# Patient Record
Sex: Male | Born: 1945 | Race: White | Hispanic: No | Marital: Married | Smoking: Never smoker
Health system: Southern US, Community
[De-identification: ages and names within clinical notes are randomized; demographics above are authoritative.]

## PROBLEM LIST (undated history)

## (undated) DIAGNOSIS — T8859XA Other complications of anesthesia, initial encounter: Secondary | ICD-10-CM

## (undated) DIAGNOSIS — M1990 Unspecified osteoarthritis, unspecified site: Secondary | ICD-10-CM

## (undated) DIAGNOSIS — I10 Essential (primary) hypertension: Secondary | ICD-10-CM

## (undated) DIAGNOSIS — E039 Hypothyroidism, unspecified: Secondary | ICD-10-CM

## (undated) DIAGNOSIS — T4145XA Adverse effect of unspecified anesthetic, initial encounter: Secondary | ICD-10-CM

## (undated) HISTORY — PX: APPENDECTOMY: SHX54

## (undated) HISTORY — PX: SURGERY OF LIP: SUR1315

## (undated) HISTORY — PX: JOINT REPLACEMENT: SHX530

---

## 2003-12-20 ENCOUNTER — Encounter: Admission: RE | Admit: 2003-12-20 | Discharge: 2003-12-20 | Payer: Self-pay | Admitting: Internal Medicine

## 2004-05-30 ENCOUNTER — Ambulatory Visit: Payer: Self-pay | Admitting: Family Medicine

## 2004-09-06 ENCOUNTER — Ambulatory Visit: Payer: Self-pay | Admitting: Family Medicine

## 2005-06-07 ENCOUNTER — Ambulatory Visit: Payer: Self-pay | Admitting: Family Medicine

## 2007-01-16 ENCOUNTER — Encounter: Payer: Self-pay | Admitting: Internal Medicine

## 2010-06-12 ENCOUNTER — Encounter: Payer: Self-pay | Admitting: Internal Medicine

## 2013-07-09 ENCOUNTER — Other Ambulatory Visit: Payer: Self-pay | Admitting: Orthopedic Surgery

## 2013-07-14 ENCOUNTER — Encounter (HOSPITAL_COMMUNITY): Payer: Self-pay | Admitting: Pharmacy Technician

## 2013-07-15 ENCOUNTER — Other Ambulatory Visit (HOSPITAL_COMMUNITY): Payer: Self-pay | Admitting: *Deleted

## 2013-07-15 NOTE — Pre-Procedure Instructions (Signed)
Grant Jenkins  07/15/2013   Your procedure is scheduled on:  Tuesday, July 28, 2013 at 8:45 AM.   [facility] at 6:45 AM.   Call this number if you have problems the morning of surgery: [phone]   Remember:   Do not eat food or drink liquids after midnight Monday, 07/27/13.   Take these medicines the morning of surgery with A SIP OF WATER: acetaminophen (TYLENOL) - if needed.  Stop Relafen (Nabumetone) as of Tuesday, 07/22/13. Do not take any NSAIDS (Ibuprofen, Aleve, etc.) prior to surgery.    Do not wear jewelry.  Do not wear lotions, powders, or cologne. You may wear deodorant.   Men may shave face and neck.  Do not bring valuables to the hospital.  [facility] is not responsible                  for any belongings or valuables.               Contacts, dentures or bridgework may not be worn into surgery.  Leave suitcase in the car. After surgery it may be brought to your room.  For patients admitted to the hospital, discharge time is determined by your                treatment team.             Special Instructions: [place] - Preparing for Surgery  Before surgery, you can play an important role.  Because skin is not sterile, your skin needs to be as free of germs as possible.  You can reduce the number of germs on you skin by washing with CHG (chlorahexidine gluconate) soap before surgery.  CHG is an antiseptic cleaner which kills germs and bonds with the skin to continue killing germs even after washing.  Please DO NOT use if you have an allergy to CHG or antibacterial soaps.  If your skin becomes reddened/irritated stop using the CHG and inform your nurse when you arrive at Short Stay.  Do not shave (including legs and underarms) for at least 48 hours prior to the first CHG shower.  You may shave your face.  Please follow these instructions carefully:   1.  Shower with CHG Soap the night before surgery and the                                 morning of Surgery.  2.  If you choose to wash your hair, wash your hair first as usual with your       normal shampoo.  3.  After you shampoo, rinse your hair and body thoroughly to remove the                      Shampoo.  4.  Use CHG as you would any other liquid soap.  You can apply chg directly       to the skin and wash gently with scrungie or a clean washcloth.  5.  Apply the CHG Soap to your body ONLY FROM THE NECK DOWN.        Do not use on open wounds or open sores.  Avoid contact with your eyes, ears, mouth and genitals (private parts).  Wash genitals (private parts) with your normal soap.  6.  Wash thoroughly, paying special attention to the area where your surgery  will be performed.  7.  Thoroughly rinse your body with warm water from the neck down.  8.  DO NOT shower/wash with your normal soap after using and rinsing off       the CHG Soap.  9.  Pat yourself dry with a clean towel.            10.  Wear clean pajamas.            11.  Place clean sheets on your bed the night of your first shower and do not        sleep with pets.  Day of Surgery  Do not apply any lotions the morning of surgery.  Please wear clean clothes to the hospital/surgery center.     Please read over the following fact sheets that you were given: Pain Booklet, Coughing and Deep Breathing, Blood Transfusion Information, MRSA Information and Surgical Site Infection Prevention

## 2013-07-16 ENCOUNTER — Encounter (HOSPITAL_COMMUNITY)
Admission: RE | Admit: 2013-07-16 | Discharge: 2013-07-16 | Disposition: A | Discharge status: Home / Self Care | Payer: Medicare Other | Source: Ambulatory Visit | Attending: Orthopedic Surgery | Admitting: Orthopedic Surgery

## 2013-07-16 ENCOUNTER — Encounter (HOSPITAL_COMMUNITY): Payer: Self-pay

## 2013-07-16 DIAGNOSIS — Z01810 Encounter for preprocedural cardiovascular examination: Secondary | ICD-10-CM | POA: Insufficient documentation

## 2013-07-16 DIAGNOSIS — Z01812 Encounter for preprocedural laboratory examination: Secondary | ICD-10-CM | POA: Insufficient documentation

## 2013-07-16 DIAGNOSIS — Z01818 Encounter for other preprocedural examination: Secondary | ICD-10-CM | POA: Insufficient documentation

## 2013-07-16 HISTORY — DX: Other complications of anesthesia, initial encounter: T88.59XA

## 2013-07-16 HISTORY — DX: Essential (primary) hypertension: I10

## 2013-07-16 HISTORY — DX: Adverse effect of unspecified anesthetic, initial encounter: T41.45XA

## 2013-07-16 HISTORY — DX: Unspecified osteoarthritis, unspecified site: M19.90

## 2013-07-16 HISTORY — DX: Hypothyroidism, unspecified: E03.9

## 2013-07-16 LAB — URINALYSIS, ROUTINE W REFLEX MICROSCOPIC
BILIRUBIN URINE: NEGATIVE
Glucose, UA: NEGATIVE mg/dL
HGB URINE DIPSTICK: NEGATIVE
Ketones, ur: NEGATIVE mg/dL
Leukocytes, UA: NEGATIVE
NITRITE: NEGATIVE
PH: 6 (ref 5.0–8.0)
Protein, ur: NEGATIVE mg/dL
SPECIFIC GRAVITY, URINE: 1.022 (ref 1.005–1.030)
Urobilinogen, UA: 0.2 mg/dL (ref 0.0–1.0)

## 2013-07-16 LAB — COMPREHENSIVE METABOLIC PANEL
ALBUMIN: 4.3 g/dL (ref 3.5–5.2)
ALK PHOS: 59 U/L (ref 39–117)
ALT: 35 U/L (ref 0–53)
AST: 25 U/L (ref 0–37)
BUN: 18 mg/dL (ref 6–23)
CO2: 24 mEq/L (ref 19–32)
Calcium: 10.1 mg/dL (ref 8.4–10.5)
Chloride: 101 mEq/L (ref 96–112)
Creatinine, Ser: 0.82 mg/dL (ref 0.50–1.35)
GFR calc Af Amer: >90 mL/min (ref >90)
GFR calc non Af Amer: >90 mL/min (ref >90)
Glucose, Bld: 98 mg/dL (ref 70–99)
POTASSIUM: 4.1 meq/L (ref 3.7–5.3)
Sodium: 142 mEq/L (ref 137–147)
Total Bilirubin: 0.4 mg/dL (ref 0.3–1.2)
Total Protein: 7.5 g/dL (ref 6.0–8.3)

## 2013-07-16 LAB — PROTIME-INR
INR: 1.04 (ref 0.00–1.49)
Prothrombin Time: 13.4 seconds (ref 11.6–15.2)

## 2013-07-16 LAB — CBC WITH DIFFERENTIAL/PLATELET
BASOS ABS: 0 10*3/uL (ref 0.0–0.1)
BASOS PCT: 0 % (ref 0–1)
Eosinophils Absolute: 0.1 10*3/uL (ref 0.0–0.7)
Eosinophils Relative: 1 % (ref 0–5)
HCT: 47 % (ref 39.0–52.0)
HEMOGLOBIN: 16.6 g/dL (ref 13.0–17.0)
Lymphocytes Relative: 17 % (ref 12–46)
Lymphs Abs: 1.8 10*3/uL (ref 0.7–4.0)
MCH: 33.2 pg (ref 26.0–34.0)
MCHC: 35.3 g/dL (ref 30.0–36.0)
MCV: 94 fL (ref 78.0–100.0)
Monocytes Absolute: 0.8 10*3/uL (ref 0.1–1.0)
Monocytes Relative: 7 % (ref 3–12)
NEUTROS ABS: 8.1 10*3/uL — AB (ref 1.7–7.7)
NEUTROS PCT: 75 % (ref 43–77)
PLATELETS: 227 10*3/uL (ref 150–400)
RBC: 5 MIL/uL (ref 4.22–5.81)
RDW: 13.8 % (ref 11.5–15.5)
WBC: 10.8 10*3/uL — ABNORMAL HIGH (ref 4.0–10.5)

## 2013-07-16 LAB — SURGICAL PCR SCREEN
MRSA, PCR: NEGATIVE
Staphylococcus aureus: POSITIVE — AB

## 2013-07-16 LAB — APTT: APTT: 30 s (ref 24–37)

## 2013-07-16 NOTE — Pre-Procedure Instructions (Signed)
Nona DellJimmie Kant  07/16/2013   Your procedure is scheduled on:  Tuesday, July 28, 2013 at 8:45 AM.   [facility] "A" Admitting Office at 6:45 AM.   Call this number if you have problems the morning of surgery: [phone]   Remember:   Do not eat food or drink liquids after midnight Monday, 07/27/13.   Take these medicines the morning of surgery with A SIP OF WATER: acetaminophen (TYLENOL) - if needed.  Stop Relafen (Nabumetone) as of Tuesday, 07/22/13. Do not take any NSAIDS (Ibuprofen, Aleve, etc.) prior to surgery.    Do not wear jewelry.  Do not wear lotions, powders, or cologne. You may wear deodorant.   Men may shave face and neck.  Do not bring valuables to the hospital.  [facility] not [facility]                  for any belongings or valuables.               Contacts, dentures or bridgework may not be worn into surgery.  Leave suitcase in the car. After surgery it may be brought to your room.  For patients admitted to the hospital, discharge time is determined by your                treatment team.             Special Instructions: [place] - Preparing for Surgery  Before surgery, you can play an important role.  Because skin is not sterile, your skin needs to be as free of germs as possible.  You can reduce the number of germs on you skin by washing with CHG (chlorahexidine gluconate) soap before surgery.  CHG is an antiseptic cleaner which kills germs and bonds with the skin to continue killing germs even after washing.  Please DO NOT use if you have an allergy to CHG or antibacterial soaps.  If your skin becomes reddened/irritated stop using the CHG and inform your nurse when you arrive at Short Stay.  Do not shave (including legs and underarms) for at least 48 hours prior to the first CHG shower.  You may shave your face.  Please follow these instructions carefully:   1.  Shower with CHG Soap the night before surgery and the                                 morning of Surgery.  2.  If you choose to wash your hair, wash your hair first as usual with your       normal shampoo.  3.  After you shampoo, rinse your hair and body thoroughly to remove the                      Shampoo.  4.  Use CHG as you would any other liquid soap.  You can apply chg directly       to the skin and wash gently with scrungie or a clean washcloth.  5.  Apply the CHG Soap to your body ONLY FROM THE NECK DOWN.        Do not use on open wounds or open sores.  Avoid contact with your eyes, ears, mouth and genitals (private parts).  Wash genitals (private parts) with your normal soap.  6.  Wash thoroughly, paying special attention to the area where your surgery  will be performed.  7.  Thoroughly rinse your body with warm water from the neck down.  8.  DO NOT shower/wash with your normal soap after using and rinsing off       the CHG Soap.  9.  Pat yourself dry with a clean towel.            10.  Wear clean pajamas.            11.  Place clean sheets on your bed the night of your first shower and do not        sleep with pets.  Day of Surgery  Do not apply any lotions the morning of surgery.  Please wear clean clothes to the hospital/surgery center.     Please read over the following fact sheets that you were given: Pain Booklet, Coughing and Deep Breathing, Blood Transfusion Information, MRSA Information and Surgical Site Infection Prevention

## 2013-07-16 NOTE — Pre-Procedure Instructions (Signed)
Grant Jenkins  07/16/2013   Your procedure is scheduled on:  Tuesday, July 28, 2013 at 8:45 AM.   Report to [facility] Entrance "A" Admitting Office at 6:45 AM.   Call this number if you have problems the morning of surgery: [phone]   Remember:   Do not eat food or drink liquids after midnight Monday, 07/27/13.   Take these medicines the morning of surgery with A SIP OF WATER: acetaminophen (TYLENOL) - if needed.  Stop Relafen (Nabumetone) as of Tuesday, 07/22/13. Do not take any NSAIDS (Ibuprofen, Aleve, etc.) prior to surgery.    Do not wear jewelry.  Do not wear lotions, powders, or cologne. You may wear deodorant.   Men may shave face and neck.  Do not bring valuables to the hospital.  [facility] is not responsible                  for any belongings or valuables.               Contacts, dentures or bridgework may not be worn into surgery.  Leave suitcase in the car. After surgery it may be brought to your room.  For patients admitted to the hospital, discharge time is determined by your                treatment team.             Special Instructions: [place] - Preparing for Surgery  Before surgery, you can play an important role.  Because skin is not sterile, your skin needs to be as free of germs as possible.  You can reduce the number of germs on you skin by washing with CHG (chlorahexidine gluconate) soap before surgery.  CHG is an antiseptic cleaner which kills germs and bonds with the skin to continue killing germs even after washing.  Please DO NOT use if you have an allergy to CHG or antibacterial soaps.  If your skin becomes reddened/irritated stop using the CHG and inform your nurse when you arrive at Short Stay.  Do not shave (including legs and underarms) for at least 48 hours prior to the first CHG shower.  You may shave your face.  Please follow these instructions carefully:   1.  Shower with CHG Soap the night before surgery and the                                 morning of Surgery.  2.  If you choose to wash your hair, wash your hair first as usual with your       normal shampoo.  3.  After you shampoo, rinse your hair and body thoroughly to remove the                      Shampoo.  4.  Use CHG as you would any other liquid soap.  You can apply chg directly       to the skin and wash gently with scrungie or a clean washcloth.  5.  Apply the CHG Soap to your body ONLY FROM THE NECK DOWN.        Do not use on open wounds or open sores.  Avoid contact with your eyes, ears, mouth and genitals (private parts).  Wash genitals (private parts) with your normal soap.  6.  Wash thoroughly, paying special attention to the area where your surgery          will be performed.  7.  Thoroughly rinse your body with warm water from the neck down.  8.  DO NOT shower/wash with your normal soap after using and rinsing off       the CHG Soap.  9.  Pat yourself dry with a clean towel.            10.  Wear clean pajamas.            11.  Place clean sheets on your bed the night of your first shower and do not        sleep with pets.  Day of Surgery  Do not apply any lotions the morning of surgery.  Please wear clean clothes to the hospital/surgery center.     Please read over the following fact sheets that you were given: Pain Booklet, Coughing and Deep Breathing, Blood Transfusion Information, MRSA Information and Surgical Site Infection Prevention

## 2013-07-17 LAB — URINE CULTURE
COLONY COUNT: NO GROWTH
CULTURE: NO GROWTH

## 2013-07-27 MED ORDER — SODIUM CHLORIDE 0.9 % IV SOLN
1000.0000 mg | INTRAVENOUS | Status: AC
  Administered 2013-07-28: 1000 mg via INTRAVENOUS
  Filled 2013-07-27: qty 10

## 2013-07-27 MED ORDER — SODIUM CHLORIDE 0.9 % IV SOLN
1500.0000 mg | INTRAVENOUS | Status: AC
  Administered 2013-07-28: 1500 mg via INTRAVENOUS
  Filled 2013-07-27: qty 1500

## 2013-07-28 ENCOUNTER — Inpatient Hospital Stay (HOSPITAL_COMMUNITY): Payer: Medicare Other | Admitting: Certified Registered Nurse Anesthetist

## 2013-07-28 ENCOUNTER — Encounter (HOSPITAL_COMMUNITY)
Admission: RE | Disposition: A | Discharge status: Home Health | Payer: Self-pay | Source: Ambulatory Visit | Attending: Orthopedic Surgery

## 2013-07-28 ENCOUNTER — Encounter (HOSPITAL_COMMUNITY): Payer: Self-pay | Admitting: *Deleted

## 2013-07-28 ENCOUNTER — Encounter (HOSPITAL_COMMUNITY): Payer: Medicare Other | Admitting: Certified Registered Nurse Anesthetist

## 2013-07-28 ENCOUNTER — Inpatient Hospital Stay (HOSPITAL_COMMUNITY)
Admission: RE | Admit: 2013-07-28 | Discharge: 2013-07-30 | DRG: 470 | Disposition: A | Discharge status: Home Health | Payer: Medicare Other | Source: Ambulatory Visit | Attending: Orthopedic Surgery | Admitting: Orthopedic Surgery

## 2013-07-28 DIAGNOSIS — Z7982 Long term (current) use of aspirin: Secondary | ICD-10-CM

## 2013-07-28 DIAGNOSIS — Z880 Allergy status to penicillin: Secondary | ICD-10-CM

## 2013-07-28 DIAGNOSIS — E039 Hypothyroidism, unspecified: Secondary | ICD-10-CM | POA: Diagnosis present

## 2013-07-28 DIAGNOSIS — Z885 Allergy status to narcotic agent status: Secondary | ICD-10-CM

## 2013-07-28 DIAGNOSIS — D62 Acute posthemorrhagic anemia: Secondary | ICD-10-CM | POA: Diagnosis not present

## 2013-07-28 DIAGNOSIS — Z79899 Other long term (current) drug therapy: Secondary | ICD-10-CM

## 2013-07-28 DIAGNOSIS — M1710 Unilateral primary osteoarthritis, unspecified knee: Principal | ICD-10-CM | POA: Diagnosis present

## 2013-07-28 DIAGNOSIS — I10 Essential (primary) hypertension: Secondary | ICD-10-CM | POA: Diagnosis present

## 2013-07-28 DIAGNOSIS — Z96659 Presence of unspecified artificial knee joint: Secondary | ICD-10-CM

## 2013-07-28 HISTORY — PX: TOTAL KNEE ARTHROPLASTY: SHX125

## 2013-07-28 LAB — CBC
HCT: 41.6 % (ref 39.0–52.0)
Hemoglobin: 14.2 g/dL (ref 13.0–17.0)
MCH: 32.4 pg (ref 26.0–34.0)
MCHC: 34.1 g/dL (ref 30.0–36.0)
MCV: 95 fL (ref 78.0–100.0)
Platelets: 177 10*3/uL (ref 150–400)
RBC: 4.38 MIL/uL (ref 4.22–5.81)
RDW: 13.5 % (ref 11.5–15.5)
WBC: 9.6 10*3/uL (ref 4.0–10.5)

## 2013-07-28 LAB — TYPE AND SCREEN
ABO/RH(D): O POS
Antibody Screen: NEGATIVE

## 2013-07-28 LAB — CREATININE, SERUM
CREATININE: 0.85 mg/dL (ref 0.50–1.35)
GFR calc Af Amer: >90 mL/min (ref >90)
GFR calc non Af Amer: 89 mL/min — ABNORMAL LOW (ref >90)

## 2013-07-28 LAB — ABO/RH: ABO/RH(D): O POS

## 2013-07-28 SURGERY — ARTHROPLASTY, KNEE, TOTAL
Anesthesia: Regional | Site: Knee | Laterality: Left

## 2013-07-28 MED ORDER — ONDANSETRON HCL 4 MG/2ML IJ SOLN
INTRAMUSCULAR | Status: DC | PRN
  Administered 2013-07-28: 4 mg via INTRAVENOUS

## 2013-07-28 MED ORDER — SENNOSIDES-DOCUSATE SODIUM 8.6-50 MG PO TABS: 1.0000 | ORAL_TABLET | Freq: Every evening | ORAL | Status: DC | PRN

## 2013-07-28 MED ORDER — ROCURONIUM BROMIDE 50 MG/5ML IV SOLN
INTRAVENOUS | Status: AC
  Filled 2013-07-28: qty 1

## 2013-07-28 MED ORDER — STERILE WATER FOR INJECTION IJ SOLN
INTRAMUSCULAR | Status: AC
  Filled 2013-07-28: qty 10

## 2013-07-28 MED ORDER — ENOXAPARIN SODIUM 30 MG/0.3ML ~~LOC~~ SOLN
30.0000 mg | Freq: Two times a day (BID) | SUBCUTANEOUS | Status: DC
  Administered 2013-07-29 – 2013-07-30 (×3): 30 mg via SUBCUTANEOUS
  Filled 2013-07-28 (×5): qty 0.3

## 2013-07-28 MED ORDER — ROPIVACAINE HCL 5 MG/ML IJ SOLN
INTRAMUSCULAR | Status: DC | PRN
  Administered 2013-07-28: 15 mL via PERINEURAL

## 2013-07-28 MED ORDER — OXYCODONE HCL ER 10 MG PO T12A
10.0000 mg | EXTENDED_RELEASE_TABLET | Freq: Two times a day (BID) | ORAL | Status: DC
  Administered 2013-07-28: 10 mg via ORAL
  Filled 2013-07-28 (×2): qty 1

## 2013-07-28 MED ORDER — MIDAZOLAM HCL 2 MG/2ML IJ SOLN
INTRAMUSCULAR | Status: AC
  Filled 2013-07-28: qty 2

## 2013-07-28 MED ORDER — PROPOFOL 10 MG/ML IV BOLUS
INTRAVENOUS | Status: AC
  Filled 2013-07-28: qty 20

## 2013-07-28 MED ORDER — FENTANYL CITRATE 0.05 MG/ML IJ SOLN
INTRAMUSCULAR | Status: AC
  Filled 2013-07-28: qty 5

## 2013-07-28 MED ORDER — METHOCARBAMOL 100 MG/ML IJ SOLN
500.0000 mg | Freq: Four times a day (QID) | INTRAMUSCULAR | Status: DC | PRN
  Filled 2013-07-28: qty 5

## 2013-07-28 MED ORDER — HYDROMORPHONE HCL PF 1 MG/ML IJ SOLN
1.0000 mg | INTRAMUSCULAR | Status: DC | PRN
  Administered 2013-07-28 – 2013-07-29 (×2): 1 mg via INTRAVENOUS
  Filled 2013-07-28 (×2): qty 1

## 2013-07-28 MED ORDER — MIDAZOLAM HCL 2 MG/2ML IJ SOLN
INTRAMUSCULAR | Status: AC
  Administered 2013-07-28: 2 mg
  Filled 2013-07-28: qty 2

## 2013-07-28 MED ORDER — AMLODIPINE BESYLATE 5 MG PO TABS
5.0000 mg | ORAL_TABLET | Freq: Every day | ORAL | Status: DC
  Administered 2013-07-29: 5 mg via ORAL
  Filled 2013-07-28 (×3): qty 1

## 2013-07-28 MED ORDER — LACTATED RINGERS IV SOLN
INTRAVENOUS | Status: DC
  Administered 2013-07-28 (×2): via INTRAVENOUS

## 2013-07-28 MED ORDER — BUPIVACAINE LIPOSOME 1.3 % IJ SUSP
20.0000 mL | Freq: Once | INTRAMUSCULAR | Status: DC
  Filled 2013-07-28: qty 20

## 2013-07-28 MED ORDER — METHOTREXATE 2.5 MG PO TABS
10.0000 mg | ORAL_TABLET | ORAL | Status: DC
Start: 2013-08-02 — End: 2013-07-30

## 2013-07-28 MED ORDER — ONDANSETRON HCL 4 MG/2ML IJ SOLN
INTRAMUSCULAR | Status: AC
  Filled 2013-07-28: qty 2

## 2013-07-28 MED ORDER — METHOCARBAMOL 500 MG PO TABS
500.0000 mg | ORAL_TABLET | Freq: Four times a day (QID) | ORAL | Status: DC | PRN
  Administered 2013-07-28 – 2013-07-29 (×2): 500 mg via ORAL
  Filled 2013-07-28 (×2): qty 1

## 2013-07-28 MED ORDER — OLMESARTAN-AMLODIPINE-HCTZ 40-5-12.5 MG PO TABS: 1.0000 | ORAL_TABLET | Freq: Every day | ORAL | Status: DC

## 2013-07-28 MED ORDER — HYDROCHLOROTHIAZIDE 12.5 MG PO CAPS
12.5000 mg | ORAL_CAPSULE | Freq: Every day | ORAL | Status: DC
  Administered 2013-07-29: 12.5 mg via ORAL
  Filled 2013-07-28 (×3): qty 1

## 2013-07-28 MED ORDER — BUPIVACAINE-EPINEPHRINE (PF) 0.5% -1:200000 IJ SOLN
INTRAMUSCULAR | Status: AC
  Filled 2013-07-28: qty 10

## 2013-07-28 MED ORDER — CHLORHEXIDINE GLUCONATE 4 % EX LIQD: 60.0000 mL | Freq: Once | CUTANEOUS | Status: DC

## 2013-07-28 MED ORDER — NABUMETONE 500 MG PO TABS
1000.0000 mg | ORAL_TABLET | Freq: Two times a day (BID) | ORAL | Status: DC | PRN
  Filled 2013-07-28 (×2): qty 2

## 2013-07-28 MED ORDER — DOCUSATE SODIUM 100 MG PO CAPS
100.0000 mg | ORAL_CAPSULE | Freq: Two times a day (BID) | ORAL | Status: DC
  Administered 2013-07-28 – 2013-07-29 (×3): 100 mg via ORAL
  Filled 2013-07-28 (×5): qty 1

## 2013-07-28 MED ORDER — CELECOXIB 200 MG PO CAPS
200.0000 mg | ORAL_CAPSULE | Freq: Two times a day (BID) | ORAL | Status: DC
  Administered 2013-07-28 – 2013-07-29 (×3): 200 mg via ORAL
  Filled 2013-07-28 (×6): qty 1

## 2013-07-28 MED ORDER — OXYCODONE HCL 5 MG PO TABS
5.0000 mg | ORAL_TABLET | ORAL | Status: DC | PRN
  Administered 2013-07-28 – 2013-07-29 (×4): 10 mg via ORAL
  Filled 2013-07-28 (×4): qty 2

## 2013-07-28 MED ORDER — METOCLOPRAMIDE HCL 5 MG/ML IJ SOLN: 5.0000 mg | Freq: Three times a day (TID) | INTRAMUSCULAR | Status: DC | PRN

## 2013-07-28 MED ORDER — BUPIVACAINE IN DEXTROSE 0.75-8.25 % IT SOLN
INTRATHECAL | Status: DC | PRN
  Administered 2013-07-28: 1.8 mL via INTRATHECAL

## 2013-07-28 MED ORDER — VANCOMYCIN HCL IN DEXTROSE 1-5 GM/200ML-% IV SOLN
1000.0000 mg | Freq: Two times a day (BID) | INTRAVENOUS | Status: AC
  Administered 2013-07-28: 1000 mg via INTRAVENOUS
  Filled 2013-07-28: qty 200

## 2013-07-28 MED ORDER — ONDANSETRON HCL 4 MG/2ML IJ SOLN: 4.0000 mg | Freq: Four times a day (QID) | INTRAMUSCULAR | Status: DC | PRN

## 2013-07-28 MED ORDER — FENTANYL CITRATE 0.05 MG/ML IJ SOLN
100.0000 ug | Freq: Once | INTRAMUSCULAR | Status: AC
  Administered 2013-07-28: 50 ug via INTRAVENOUS

## 2013-07-28 MED ORDER — MENTHOL 3 MG MT LOZG: 1.0000 | LOZENGE | OROMUCOSAL | Status: DC | PRN

## 2013-07-28 MED ORDER — PHENOL 1.4 % MT LIQD: 1.0000 | OROMUCOSAL | Status: DC | PRN

## 2013-07-28 MED ORDER — DIPHENHYDRAMINE HCL 12.5 MG/5ML PO ELIX: 12.5000 mg | ORAL_SOLUTION | ORAL | Status: DC | PRN

## 2013-07-28 MED ORDER — MIDAZOLAM HCL 5 MG/ML IJ SOLN: 2.0000 mg | Freq: Once | INTRAMUSCULAR | Status: DC

## 2013-07-28 MED ORDER — FENTANYL CITRATE 0.05 MG/ML IJ SOLN
INTRAMUSCULAR | Status: AC
  Administered 2013-07-28: 50 ug via INTRAVENOUS
  Filled 2013-07-28: qty 2

## 2013-07-28 MED ORDER — ONDANSETRON HCL 4 MG PO TABS
4.0000 mg | ORAL_TABLET | Freq: Four times a day (QID) | ORAL | Status: DC | PRN
  Administered 2013-07-29: 4 mg via ORAL
  Filled 2013-07-28: qty 1

## 2013-07-28 MED ORDER — BISACODYL 5 MG PO TBEC
5.0000 mg | DELAYED_RELEASE_TABLET | Freq: Every day | ORAL | Status: DC | PRN
Start: 2013-07-28 — End: 2013-07-30

## 2013-07-28 MED ORDER — BUPIVACAINE LIPOSOME 1.3 % IJ SUSP
INTRAMUSCULAR | Status: DC | PRN
  Administered 2013-07-28: 20 mL

## 2013-07-28 MED ORDER — SODIUM CHLORIDE 0.9 % IR SOLN
Status: DC | PRN
  Administered 2013-07-28 (×2): 1000 mL

## 2013-07-28 MED ORDER — ACETAMINOPHEN 325 MG PO TABS: 650.0000 mg | ORAL_TABLET | Freq: Four times a day (QID) | ORAL | Status: DC | PRN

## 2013-07-28 MED ORDER — SODIUM CHLORIDE 0.9 % IV SOLN: INTRAVENOUS | Status: DC

## 2013-07-28 MED ORDER — FLEET ENEMA 7-19 GM/118ML RE ENEM: 1.0000 | ENEMA | Freq: Once | RECTAL | Status: AC | PRN

## 2013-07-28 MED ORDER — ARTIFICIAL TEARS OP OINT
TOPICAL_OINTMENT | OPHTHALMIC | Status: AC
  Filled 2013-07-28: qty 3.5

## 2013-07-28 MED ORDER — BUPIVACAINE-EPINEPHRINE 0.5% -1:200000 IJ SOLN
INTRAMUSCULAR | Status: DC | PRN
  Administered 2013-07-28: 30 mL

## 2013-07-28 MED ORDER — PROPOFOL INFUSION 10 MG/ML OPTIME
INTRAVENOUS | Status: DC | PRN
  Administered 2013-07-28: 50 ug/kg/min via INTRAVENOUS

## 2013-07-28 MED ORDER — METOCLOPRAMIDE HCL 10 MG PO TABS
5.0000 mg | ORAL_TABLET | Freq: Three times a day (TID) | ORAL | Status: DC | PRN
  Administered 2013-07-29: 10 mg via ORAL
  Filled 2013-07-28: qty 1

## 2013-07-28 MED ORDER — ALUM & MAG HYDROXIDE-SIMETH 200-200-20 MG/5ML PO SUSP: 30.0000 mL | ORAL | Status: DC | PRN

## 2013-07-28 MED ORDER — MIDAZOLAM HCL 5 MG/5ML IJ SOLN
INTRAMUSCULAR | Status: DC | PRN
  Administered 2013-07-28 (×2): 1 mg via INTRAVENOUS

## 2013-07-28 MED ORDER — EPHEDRINE SULFATE 50 MG/ML IJ SOLN
INTRAMUSCULAR | Status: AC
  Filled 2013-07-28: qty 1

## 2013-07-28 MED ORDER — ACETAMINOPHEN 650 MG RE SUPP: 650.0000 mg | Freq: Four times a day (QID) | RECTAL | Status: DC | PRN

## 2013-07-28 MED ORDER — SUCCINYLCHOLINE CHLORIDE 20 MG/ML IJ SOLN
INTRAMUSCULAR | Status: AC
  Filled 2013-07-28: qty 1

## 2013-07-28 MED ORDER — IRBESARTAN 300 MG PO TABS
300.0000 mg | ORAL_TABLET | Freq: Every day | ORAL | Status: DC
  Administered 2013-07-29: 300 mg via ORAL
  Filled 2013-07-28 (×3): qty 1

## 2013-07-28 SURGICAL SUPPLY — 61 items
BANDAGE ESMARK 6X9 LF (GAUZE/BANDAGES/DRESSINGS) ×1 IMPLANT
BLADE SAGITTAL 13X1.27X60 (BLADE) ×2 IMPLANT
BLADE SAGITTAL 13X1.27X60MM (BLADE) ×1
BLADE SAW SGTL 83.5X18.5 (BLADE) ×3 IMPLANT
BNDG ESMARK 6X9 LF (GAUZE/BANDAGES/DRESSINGS) ×3
BOWL SMART MIX CTS (DISPOSABLE) ×3 IMPLANT
CAP POR TM CP VIT E LN CER HD ×3 IMPLANT
CEMENT BONE SIMPLEX SPEEDSET (Cement) ×3 IMPLANT
COVER SURGICAL LIGHT HANDLE (MISCELLANEOUS) ×3 IMPLANT
CUFF TOURNIQUET SINGLE 34IN LL (TOURNIQUET CUFF) ×3 IMPLANT
DRAPE EXTREMITY T 121X128X90 (DRAPE) ×3 IMPLANT
DRAPE INCISE IOBAN 66X45 STRL (DRAPES) ×6 IMPLANT
DRAPE PROXIMA HALF (DRAPES) ×3 IMPLANT
DRAPE U-SHAPE 47X51 STRL (DRAPES) ×3 IMPLANT
DRSG ADAPTIC 3X8 NADH LF (GAUZE/BANDAGES/DRESSINGS) ×3 IMPLANT
DRSG PAD ABDOMINAL 8X10 ST (GAUZE/BANDAGES/DRESSINGS) ×6 IMPLANT
DURAPREP 26ML APPLICATOR (WOUND CARE) ×3 IMPLANT
ELECT REM PT RETURN 9FT ADLT (ELECTROSURGICAL) ×3
ELECTRODE REM PT RTRN 9FT ADLT (ELECTROSURGICAL) ×1 IMPLANT
EVACUATOR 1/8 PVC DRAIN (DRAIN) ×3 IMPLANT
GLOVE BIOGEL M 7.0 STRL (GLOVE) ×3 IMPLANT
GLOVE BIOGEL PI IND STRL 7.0 (GLOVE) ×1 IMPLANT
GLOVE BIOGEL PI IND STRL 7.5 (GLOVE) ×1 IMPLANT
GLOVE BIOGEL PI IND STRL 8 (GLOVE) ×1 IMPLANT
GLOVE BIOGEL PI IND STRL 8.5 (GLOVE) ×1 IMPLANT
GLOVE BIOGEL PI INDICATOR 7.0 (GLOVE) ×2
GLOVE BIOGEL PI INDICATOR 7.5 (GLOVE) ×2
GLOVE BIOGEL PI INDICATOR 8 (GLOVE) ×2
GLOVE BIOGEL PI INDICATOR 8.5 (GLOVE) ×2
GLOVE BIOGEL PI ORTHO PRO 7.5 (GLOVE) ×2
GLOVE ECLIPSE 6.5 STRL STRAW (GLOVE) ×3 IMPLANT
GLOVE PI ORTHO PRO STRL 7.5 (GLOVE) ×1 IMPLANT
GLOVE SURG ORTHO 8.0 STRL STRW (GLOVE) ×6 IMPLANT
GLOVE SURG SS PI 7.5 STRL IVOR (GLOVE) ×3 IMPLANT
GOWN PREVENTION PLUS XLARGE (GOWN DISPOSABLE) ×9 IMPLANT
GOWN STRL NON-REIN LRG LVL3 (GOWN DISPOSABLE) ×6 IMPLANT
HANDPIECE INTERPULSE COAX TIP (DISPOSABLE) ×2
HOOD PEEL AWAY FACE SHEILD DIS (HOOD) ×12 IMPLANT
KIT BASIN OR (CUSTOM PROCEDURE TRAY) ×3 IMPLANT
KIT ROOM TURNOVER OR (KITS) ×3 IMPLANT
MANIFOLD NEPTUNE II (INSTRUMENTS) ×3 IMPLANT
NEEDLE 22X1 1/2 (OR ONLY) (NEEDLE) ×6 IMPLANT
NS IRRIG 1000ML POUR BTL (IV SOLUTION) ×3 IMPLANT
PACK TOTAL JOINT (CUSTOM PROCEDURE TRAY) ×3 IMPLANT
PAD ARMBOARD 7.5X6 YLW CONV (MISCELLANEOUS) ×6 IMPLANT
PADDING CAST COTTON 6X4 STRL (CAST SUPPLIES) ×3 IMPLANT
SET HNDPC FAN SPRY TIP SCT (DISPOSABLE) ×1 IMPLANT
SPONGE GAUZE 4X4 12PLY (GAUZE/BANDAGES/DRESSINGS) ×3 IMPLANT
STAPLER VISISTAT 35W (STAPLE) ×3 IMPLANT
SUCTION FRAZIER TIP 10 FR DISP (SUCTIONS) ×3 IMPLANT
SUT BONE WAX W31G (SUTURE) ×3 IMPLANT
SUT VIC AB 0 CTB1 27 (SUTURE) ×6 IMPLANT
SUT VIC AB 1 CT1 27 (SUTURE) ×6
SUT VIC AB 1 CT1 27XBRD ANBCTR (SUTURE) ×3 IMPLANT
SUT VIC AB 2-0 CT1 27 (SUTURE) ×4
SUT VIC AB 2-0 CT1 TAPERPNT 27 (SUTURE) ×2 IMPLANT
SYR CONTROL 10ML LL (SYRINGE) ×6 IMPLANT
TOWEL OR 17X24 6PK STRL BLUE (TOWEL DISPOSABLE) ×3 IMPLANT
TOWEL OR 17X26 10 PK STRL BLUE (TOWEL DISPOSABLE) ×3 IMPLANT
TRAY FOLEY CATH 14FR (SET/KITS/TRAYS/PACK) IMPLANT
WATER STERILE IRR 1000ML POUR (IV SOLUTION) IMPLANT

## 2013-07-28 NOTE — Transfer of Care (Signed)
Immediate Anesthesia Transfer of Care Note  Patient: Grant Jenkins  Procedure(s) Performed: Procedure(s): TOTAL KNEE ARTHROPLASTY (Left)  Patient Location: PACU  Anesthesia Type:MAC combined with regional for post-op pain  Level of Consciousness: awake, alert  and oriented  Airway & Oxygen Therapy: Patient Spontanous Breathing and Patient connected to nasal cannula oxygen  Post-op Assessment: Report given to PACU RN and Post -op Vital signs reviewed and stable  Post vital signs: Reviewed and stable  Complications: No apparent anesthesia complications

## 2013-07-28 NOTE — Anesthesia Procedure Notes (Addendum)
Procedure Name: MAC Date/Time: 07/28/2013 8:37 AM Performed by: Margaree MackintoshYACOUB, ALISHA B Pre-anesthesia Checklist: Patient identified, Emergency Drugs available, Suction available, Patient being monitored and Timeout performed Patient Re-evaluated:Patient Re-evaluated prior to inductionOxygen Delivery Method: Simple face mask Preoxygenation: Pre-oxygenation with 100% oxygen Intubation Type: IV induction Placement Confirmation: positive ETCO2 Dental Injury: Teeth and Oropharynx as per pre-operative assessment     Spinal  Patient location during procedure: pre-op Start time: 07/28/2013 8:23 AM End time: 07/28/2013 8:27 AM Staffing Anesthesiologist: Axell Trigueros, CHRIS Performed by: anesthesiologist  Preanesthetic Checklist Completed: patient identified, surgical consent, pre-op evaluation, timeout performed, IV checked, risks and benefits discussed and monitors and equipment checked Spinal Block Patient position: sitting Prep: Betadine Patient monitoring: cardiac monitor, continuous pulse ox and blood pressure Approach: midline Location: L4-5 Injection technique: single-shot Needle Needle type: Pencan  Needle gauge: 24 G Needle length: 10 cm Assessment Sensory level: T6 Additional Notes Functioning IV was confirmed and monitors were applied. Sterile prep and drape, including hand hygiene and sterile gloves were used. The patient was positioned and the spine was prepped. The skin was anesthetized with lidocaine.  Free flow of clear CSF was obtained prior to injecting local anesthetic into the CSF.  The spinal needle aspirated freely following injection.  The needle was carefully withdrawn.  The patient tolerated the procedure well.   Anesthesia Regional Block:  Adductor canal block  Pre-Anesthetic Checklist: ,, timeout performed, Correct Patient, Correct Site, Correct Laterality, Correct Procedure, Correct Position, site marked, Risks and benefits discussed,  Surgical consent,  Pre-op evaluation,  At  surgeon's request and post-op pain management  Laterality: Lower and Left  Prep: chloraprep       Needles:  Injection technique: Single-shot  Needle Type: Echogenic Needle          Additional Needles:  Procedures: ultrasound guided (picture in chart) Adductor canal block Narrative:  Start time: 07/28/2013 8:02 AM End time: 07/28/2013 8:19 AM Injection made incrementally with aspirations every 5 mL.  Performed by: Personally  Anesthesiologist: Efrem Pitstick  Additional Notes: H+P and labs reviewed, risks and benefits discussed with patient, procedure tolerated well without complications

## 2013-07-28 NOTE — Anesthesia Preprocedure Evaluation (Signed)
Anesthesia Evaluation  Patient identified by MRN, date of birth, ID band Patient awake    Reviewed: Allergy & Precautions, H&P , NPO status , Patient's Chart, lab work & pertinent test results  History of Anesthesia Complications Negative for: history of anesthetic complications  Airway Mallampati: II TM Distance: >3 FB Neck ROM: Full    Dental  (+) Teeth Intact   Pulmonary neg pulmonary ROS,  breath sounds clear to auscultation        Cardiovascular hypertension, Pt. on medications - angina- CAD, - Past MI, - CHF and - DOE Rhythm:Regular Rate:Normal     Neuro/Psych negative neurological ROS  negative psych ROS   GI/Hepatic negative GI ROS, Neg liver ROS,   Endo/Other  negative endocrine ROS  Renal/GU negative Renal ROS     Musculoskeletal  (+) Arthritis -, Osteoarthritis,    Abdominal   Peds  Hematology   Anesthesia Other Findings   Reproductive/Obstetrics                           Anesthesia Physical Anesthesia Plan  ASA: II  Anesthesia Plan: General and Regional   Post-op Pain Management:    Induction: Intravenous  Airway Management Planned: LMA  Additional Equipment: None  Intra-op Plan:   Post-operative Plan: Extubation in OR  Informed Consent: I have reviewed the patients History and Physical, chart, labs and discussed the procedure including the risks, benefits and alternatives for the proposed anesthesia with the patient or authorized representative who has indicated his/her understanding and acceptance.   Dental advisory given  Plan Discussed with: CRNA and Surgeon  Anesthesia Plan Comments:         Anesthesia Quick Evaluation

## 2013-07-28 NOTE — Preoperative (Signed)
Beta Blockers   Reason not to administer Beta Blockers:Not Applicable 

## 2013-07-28 NOTE — Evaluation (Signed)
Physical Therapy Evaluation Patient Details Name: Grant Jenkins MRN: [id number] DOB: 1945/10/12 Today's Date: 07/28/2013 Time: 4098-1191 PT Time Calculation (min): 24 min  PT Assessment / Plan / Recommendation History of Present Illness  Patient is a 68 yo male s/p Lt TKA.  Clinical Impression  Patient presents with problems listed below.  Will benefit from acute PT to maximize independence prior to discharge home with wife.    PT Assessment  Patient needs continued PT services    Follow Up Recommendations  Home health PT;Supervision/Assistance - 24 hour    Does the patient have the potential to tolerate intense rehabilitation      Barriers to Discharge        Equipment Recommendations  None recommended by PT    Recommendations for Other Services     Frequency 7X/week    Precautions / Restrictions Precautions Precautions: Knee Precaution Booklet Issued: Yes (comment) Precaution Comments: Reviewed precautions with patient and wife. Restrictions Weight Bearing Restrictions: Yes LLE Weight Bearing: Weight bearing as tolerated   Pertinent Vitals/Pain Pain and nausea limiting session today.      Mobility  Bed Mobility Overal bed mobility: Needs Assistance Bed Mobility: Supine to Sit Supine to sit: Min assist General bed mobility comments: Verbal cues for technique.  Assist to move LLE off of bed.  Good sitting balance once upright. Transfers Overall transfer level: Needs assistance Equipment used: Rolling walker (2 wheeled) Transfers: Sit to/from Stand Sit to Stand: Min assist General transfer comment: Verbal cues for hand placement and technique.  Assist to rise to standing and for balance initially. Ambulation/Gait Ambulation/Gait assistance: Min assist Ambulation Distance (Feet): 12 Feet Assistive device: Rolling walker (2 wheeled) Gait Pattern/deviations: Step-to pattern;Decreased stance time - left;Decreased step length - right;Decreased weight shift to  left;Antalgic;Trunk flexed Gait velocity interpretation: Below normal speed for age/gender General Gait Details: Verbal cues for safe use of RW, gait sequence.  Patient able to ambulate 12'.  Once in chair, patient became nauseated.  RN called to room.    Exercises Total Joint Exercises Ankle Circles/Pumps: AROM;Both;10 reps;Seated   PT Diagnosis: Difficulty walking;Acute pain  PT Problem List: Decreased strength;Decreased range of motion;Decreased activity tolerance;Decreased balance;Decreased mobility;Decreased knowledge of use of DME;Decreased knowledge of precautions;Pain PT Treatment Interventions: DME instruction;Gait training;Stair training;Functional mobility training;Therapeutic exercise;Patient/family education     PT Goals(Current goals can be found in the care plan section) Acute Rehab PT Goals Patient Stated Goal: To go home tomorrow PT Goal Formulation: With patient/family Time For Goal Achievement: 08/04/13 Potential to Achieve Goals: Good  Visit Information  Last PT Received On: 07/28/13 Assistance Needed: +1 History of Present Illness: Patient is a 68 yo male s/p Lt TKA.       Prior Functioning  Home Living Family/patient expects to be discharged to:: Private residence Living Arrangements: Spouse/significant other Available Help at Discharge: Family;Available 24 hours/day Type of Home: House Home Access: Stairs to enter [employer] of Steps: 3 Entrance Stairs-Rails: None Home Layout: One level Home Equipment: Walker - 2 wheels;Bedside commode Prior Function Level of Independence: Independent Communication Communication: No difficulties    Cognition  Cognition Arousal/Alertness: Awake/alert Behavior During Therapy: WFL for tasks assessed/performed Overall Cognitive Status: Within Functional Limits for tasks assessed    Extremity/Trunk Assessment Upper Extremity Assessment Upper Extremity Assessment: Overall WFL for tasks assessed Lower  Extremity Assessment Lower Extremity Assessment: LLE deficits/detail LLE Deficits / Details: Limited strength due to pain/surgery. LLE: Unable to fully assess due to pain   Balance  End of Session PT - End of Session Equipment Utilized During Treatment: Gait belt Activity Tolerance: Patient limited by pain (Limited by nausea) Patient left: in chair;with call bell/phone within reach;with family/visitor present;with nursing/sitter in room Nurse Communication: Mobility status (Nauseated) CPM Left Knee CPM Left Knee: Off  GP     Vena AustriaDavis, Brodan Grewell H 07/28/2013, 4:59 PM Durenda HurtSusan H. Renaldo Fiddleravis, PT, [facility] Acute Rehab Services Pager [phone]

## 2013-07-28 NOTE — Care Management Note (Signed)
CARE MANAGEMENT NOTE 07/28/2013  Patient:  Leather,Ishan   Account Number:  [id number]  Date Initiated:  07/28/2013  Documentation initiated by:  Vance PeperBRADY,Dustee Bottenfield  Subjective/Objective Assessment:   68 yr old male s/p left total knee arthroplasty.     Action/Plan:   Case manager spoke to patient and wife concerning home health and DME needs at discharge. [facility]. DME has been delivered. Has family support at discharge.   Anticipated DC Date:  07/29/2013   Anticipated DC Plan:  HOME W HOME HEALTH SERVICES      DC Planning Services  CM consult      [facility]  [facility]  DURABLE MEDICAL EQUIPMENT   Choice offered to / List presented to:  C-1 Patient   DME arranged  CPM  3-N-1  WALKER - ROLLING      DME agency  [employer]     HH arranged  HH-2 PT      [facility]   Status of service:  Completed, signed off Medicare Important Message given?   (If response is "NO", the following Medicare IM given date fields will be blank) Date Medicare IM given:   Date Additional Medicare IM given:    Discharge Disposition:  HOME W HOME HEALTH SERVICES

## 2013-07-28 NOTE — H&P (Signed)
Grant Jenkins MRN:  [id number] DOB/SEX:  11-23-1945/male  CHIEF COMPLAINT:  Painful left Knee  HISTORY: Patient is a 68 y.o. male presented with a history of pain in the left knee. Onset of symptoms was gradual starting several years ago with gradually worsening course since that time. Prior procedures on the knee include none. Patient has been treated conservatively with over-the-counter NSAIDs and activity modification. Patient currently rates pain in the knee at 9 out of 10 with activity. There is pain at night.  PAST MEDICAL HISTORY: There are no active problems to display for this patient.  Past Medical History  Diagnosis Date  . Hypothyroidism   . Arthritis     psoriatic  . Hypertension     Grant Jenkins   Grant Jenkins  . Complication of anesthesia     pt stated he was slow to wake up   Past Surgical History  Procedure Laterality Date  . Appendectomy    . Joint replacement      cut from accident  . Surgery of lip       MEDICATIONS:   Prescriptions prior to admission  Medication Sig Dispense Refill  . acetaminophen (TYLENOL) 325 MG tablet Take 650 mg by mouth every 6 (six) hours as needed for mild pain, moderate pain, fever or headache.      Marland Kitchen. aspirin EC 81 MG tablet Take 81 mg by mouth daily.      . clobetasol cream (TEMOVATE) 0.05 % Apply 1 application topically 2 (two) times daily.      . folic acid (FOLVITE) 1 MG tablet Take 1 mg by mouth daily.      . methotrexate (RHEUMATREX) 2.5 MG tablet Take 10 mg by mouth once a week. Caution:Chemotherapy. Protect from light.      . nabumetone (RELAFEN) 500 MG tablet Take 1,000 mg by mouth 2 (two) times daily as needed for mild pain or moderate pain.      . Olmesartan-Amlodipine-HCTZ (TRIBENZOR) 40-5-12.5 MG TABS Take 1 tablet by mouth daily.        ALLERGIES:   Allergies  Allergen Reactions  . Hydrocodone Nausea And Vomiting  . Penicillins     REVIEW OF SYSTEMS:  Pertinent items are noted in HPI.   FAMILY HISTORY:   History reviewed. No pertinent family history.  SOCIAL HISTORY:   History  Substance Use Topics  . Smoking status: Never Smoker   . Smokeless tobacco: Not on file  . Alcohol Use: No     EXAMINATION:  Vital signs in last 24 hours:    General appearance: alert, cooperative and no distress Lungs: clear to auscultation bilaterally Heart: regular rate and rhythm, S1, S2 normal, no murmur, click, rub or gallop Abdomen: soft, non-tender; bowel sounds normal; no masses,  no organomegaly Extremities: extremities normal, atraumatic, no cyanosis or edema and Homans sign is negative, no sign of DVT Pulses: 2+ and symmetric Skin: Skin color, texture, turgor normal. No rashes or lesions Neurologic: Alert and oriented X 3, normal strength and tone. Normal symmetric reflexes. Normal coordination and gait  Musculoskeletal:  ROM 0-115, Ligaments intact,  Imaging Review Plain radiographs demonstrate severe degenerative joint disease of the left knee. The overall alignment is mild valgus. The bone quality appears to be good for age and reported activity level.  Assessment/Plan: End stage arthritis, left knee   The patient history, physical examination and imaging studies are consistent with advanced degenerative joint disease of the left knee. The patient has failed conservative treatment.  The  clearance notes were reviewed.  After discussion with the patient it was felt that Total Knee Replacement was indicated. The procedure,  risks, and benefits of total knee arthroplasty were presented and reviewed. The risks including but not limited to aseptic loosening, infection, blood clots, vascular injury, stiffness, patella tracking problems complications among others were discussed. The patient acknowledged the explanation, agreed to proceed with the plan.  Grant Jenkins 07/28/2013, 6:50 AM

## 2013-07-28 NOTE — Progress Notes (Signed)
Orthopedic Tech Progress Note Patient Details:  Grant Jenkins Apr 19, 1946 [id number] CPM applied to Left LE with appropriate settings. OHF applied to bed. Footsie roll provided.  CPM Left Knee CPM Left Knee: On Left Knee Flexion (Degrees): 90 Left Knee Extension (Degrees): 0   Asia R Thompson 07/28/2013, 12:00 PM

## 2013-07-29 ENCOUNTER — Encounter (HOSPITAL_COMMUNITY): Payer: Self-pay

## 2013-07-29 LAB — CBC
HCT: 37.8 % — ABNORMAL LOW (ref 39.0–52.0)
HEMOGLOBIN: 12.8 g/dL — AB (ref 13.0–17.0)
MCH: 32.2 pg (ref 26.0–34.0)
MCHC: 33.9 g/dL (ref 30.0–36.0)
MCV: 95.2 fL (ref 78.0–100.0)
PLATELETS: 167 10*3/uL (ref 150–400)
RBC: 3.97 MIL/uL — ABNORMAL LOW (ref 4.22–5.81)
RDW: 13.5 % (ref 11.5–15.5)
WBC: 12 10*3/uL — AB (ref 4.0–10.5)

## 2013-07-29 LAB — BASIC METABOLIC PANEL
BUN: 22 mg/dL (ref 6–23)
CALCIUM: 8.6 mg/dL (ref 8.4–10.5)
CO2: 25 mEq/L (ref 19–32)
Chloride: 102 mEq/L (ref 96–112)
Creatinine, Ser: 0.76 mg/dL (ref 0.50–1.35)
GFR calc Af Amer: >90 mL/min (ref >90)
GFR calc non Af Amer: >90 mL/min (ref >90)
Glucose, Bld: 144 mg/dL — ABNORMAL HIGH (ref 70–99)
POTASSIUM: 4.3 meq/L (ref 3.7–5.3)
SODIUM: 137 meq/L (ref 137–147)

## 2013-07-29 MED ORDER — HYDROMORPHONE HCL 2 MG PO TABS
2.0000 mg | ORAL_TABLET | ORAL | Status: DC | PRN
  Administered 2013-07-30: 2 mg via ORAL
  Filled 2013-07-29: qty 1

## 2013-07-29 MED ORDER — OXYCODONE HCL 5 MG PO TABS: 5.0000 mg | ORAL_TABLET | ORAL | Status: AC | PRN

## 2013-07-29 MED ORDER — ONDANSETRON HCL 4 MG PO TABS: 4.0000 mg | ORAL_TABLET | Freq: Four times a day (QID) | ORAL | Status: AC | PRN

## 2013-07-29 MED ORDER — ENOXAPARIN SODIUM 40 MG/0.4ML ~~LOC~~ SOLN: 40.0000 mg | SUBCUTANEOUS | Status: AC

## 2013-07-29 MED ORDER — METHOCARBAMOL 500 MG PO TABS: 500.0000 mg | ORAL_TABLET | Freq: Four times a day (QID) | ORAL | Status: AC | PRN

## 2013-07-29 MED ORDER — HYDROMORPHONE HCL 2 MG PO TABS: 2.0000 mg | ORAL_TABLET | ORAL | Status: AC | PRN

## 2013-07-29 NOTE — Progress Notes (Signed)
Physical Therapy Treatment Patient Details Name: Grant Jenkins MRN: [id number] DOB: 26-Feb-1946 Today's Date: 07/29/2013 Time: [id number] PT Time Calculation (min): 34 min  PT Assessment / Plan / Recommendation  History of Present Illness Patient is a 68 yo male s/p Lt TKA.   PT Comments   Showing improving knee control with therex; Unfortunately, nausea and stomach discomfort limited all functional mobility; Once EOB, pt began dry-heaving and requested to return to bed  Will plan to see him again this afternoon in hopes that his nausea will improve and he will be able to participate  Follow Up Recommendations  Home health PT;Supervision/Assistance - 24 hour     Does the patient have the potential to tolerate intense rehabilitation     Barriers to Discharge        Equipment Recommendations  None recommended by PT    Recommendations for Other Services    Frequency 7X/week   Progress towards PT Goals Progress towards PT goals: Progressing toward goals  Plan Current plan remains appropriate    Precautions / Restrictions Precautions Precautions: Knee Precaution Comments: Reviewed precautions with patient and wife. Restrictions Weight Bearing Restrictions: Yes LLE Weight Bearing: Weight bearing as tolerated   Pertinent Vitals/Pain More complaining of nausea than pain this session patient repositioned for comfort in CPM    Mobility  Bed Mobility Overal bed mobility: Needs Assistance Bed Mobility: Supine to Sit;Sit to Supine Supine to sit: Min assist Sit to supine: Min assist General bed mobility comments: Verbal cues for technique.  Assist to move LLE off of and onto bed.   Transfers Overall transfer level: Needs assistance Equipment used: Rolling walker (2 wheeled) Transfers: Sit to/from Stand Sit to Stand: Min assist General transfer comment: VC's for hand placement required. Pt became agitated that he could not bend L knee to assist with standing    Exercises Total  Joint Exercises Ankle Circles/Pumps: AROM;Both;10 reps;Supine Quad Sets: AROM;Left;20 reps;Supine Short Arc Quad: AAROM;Left;10 reps;Supine Heel Slides: AROM;AAROM;Left;10 reps;Supine Hip ABduction/ADduction: AROM;Left;10 reps;Supine Straight Leg Raises: AAROM;AROM;Left;10 reps;Supine   PT Diagnosis:    PT Problem List:   PT Treatment Interventions:     PT Goals (current goals can now be found in the care plan section) Acute Rehab PT Goals Patient Stated Goal: To go home tomorrow PT Goal Formulation: With patient/family Time For Goal Achievement: 08/04/13 Potential to Achieve Goals: Good  Visit Information  Last PT Received On: 07/29/13 Assistance Needed: +1 History of Present Illness: Patient is a 68 yo male s/p Lt TKA.    Subjective Data  Subjective: Very nauseated, and stomach discomfort limiting all mobility Patient Stated Goal: To go home tomorrow   Cognition  Cognition Arousal/Alertness: Awake/alert Behavior During Therapy: WFL for tasks assessed/performed Overall Cognitive Status: Within Functional Limits for tasks assessed    Balance     End of Session PT - End of Session Activity Tolerance: Other (comment) (Significantly limited by nausea) Patient left: in bed;in CPM;with call bell/phone within reach;with family/visitor present Nurse Communication:  (Nausea) CPM Left Knee CPM Left Knee: On Left Knee Flexion (Degrees): 60 Left Knee Extension (Degrees): 0 Additional Comments: Pt educated to increase CPM by 5 degrees intermittently to tolerance in order to increase to 0-90 degrees   GP     Van ClinesGarrigan, Grant Jenkins 07/29/2013, 12:10 PM  Van ClinesHolly Sanaii Jenkins, PT  Acute Rehabilitation Services Pager [phone] Office [phone]

## 2013-07-29 NOTE — Discharge Instructions (Signed)
Diet: As you were doing prior to hospitalization  ° °Activity:  Increase activity slowly as tolerated  °                No lifting or driving for 6 weeks ° °Shower:  May shower without a dressing once there is no drainage from your wound. °                Do NOT wash over the wound. °                °Dressing:  You may change your dressing on Wednesday °                   Then change the dressing daily with sterile 4"x4"s gauze dressing  °                   And TED hose for knees. ° °Weight Bearing:  Weight bearing as tolerated as taught in physical therapy.  Use a                                walker or Crutches as instructed. ° °To prevent constipation: you may use a stool softener such as - °              Colace ( over the counter) 100 mg by mouth twice a day  °              Drink plenty of fluids ( prune juice may be helpful) and high fiber foods °               Miralax ( over the counter) for constipation as needed.   ° °Precautions:  If you experience chest pain or shortness of breath - call 911 immediately               For transfer to the hospital emergency department!! °              If you develop a fever greater that 101 F, purulent drainage from wound,                             increased redness or drainage from wound, or calf pain -- Call the office. ° °Follow- Up Appointment:  Please call for an appointment to be seen on 08/12/13 °                                             [place] office:  [phone] °           [address] [place], [place] [address] °               ° ° °

## 2013-07-29 NOTE — Anesthesia Postprocedure Evaluation (Signed)
  Anesthesia Post-op Note  Patient: Grant Jenkins  Procedure(s) Performed: Procedure(s): TOTAL KNEE ARTHROPLASTY (Left)  Patient Location: PACU  Anesthesia Type:Regional and Spinal  Level of Consciousness: awake, alert  and oriented  Airway and Oxygen Therapy: Patient Spontanous Breathing  Post-op Pain: none  Post-op Assessment: Post-op Vital signs reviewed, Patient's Cardiovascular Status Stable, Respiratory Function Stable, No signs of Nausea or vomiting and Pain level controlled  Post-op Vital Signs: Reviewed and stable  Complications: No apparent anesthesia complications

## 2013-07-29 NOTE — Progress Notes (Signed)
SPORTS MEDICINE AND JOINT REPLACEMENT  Grant Spurling, MD   Grant Cabal, PA-C [address] [place], [place], [place]  [address]                             [phone]   PROGRESS NOTE  Subjective:  negative for Chest Pain  negative for Shortness of Breath  negative for Nausea/Vomiting   negative for Calf Pain  negative for Bowel Movement   Tolerating Diet: yes         Patient reports pain as 6 on 0-10 scale.    Objective: Vital signs in last 24 hours:   Patient Vitals for the past 24 hrs:  BP Temp Temp src Pulse Resp SpO2  07/29/13 0800 - - - - 16 94 %  07/29/13 0650 124/73 mmHg 98.3 F (36.8 C) Oral 73 16 98 %  07/29/13 0230 117/66 mmHg 98.3 F (36.8 C) Oral 67 16 96 %  07/28/13 2136 134/58 mmHg 97.9 F (36.6 C) Oral 69 16 96 %  07/28/13 1245 119/65 mmHg 97 F (36.1 C) - - 16 100 %  07/28/13 1232 107/65 mmHg - - 46 15 99 %  07/28/13 1230 - 97 F (36.1 C) - 48 18 100 %  07/28/13 1215 101/61 mmHg - - 40 20 96 %  07/28/13 1202 107/59 mmHg - - 41 13 99 %  07/28/13 1200 - - - 46 17 97 %  07/28/13 1145 96/56 mmHg - - 43 12 99 %  07/28/13 1130 99/59 mmHg - - 43 16 99 %  07/28/13 1115 107/50 mmHg - - 49 15 96 %  07/28/13 1100 92/57 mmHg 97.4 F (36.3 C) - 49 13 100 %    @flow {1959:LAST@   Intake/Output from previous day:   03/09 0701 - 03/10 0700 In: 2490 [P.O.:240; I.V.:2250] Out: 1625 [Urine:600; Drains:975]   Intake/Output this shift:       Intake/Output     03/09 0701 - 03/10 0700 03/10 0701 - 03/11 0700   P.O. 240    I.V. 2250    Total Intake 2490     Urine 600    Drains 975    Blood 50    Total Output 1625     Net +865             LABORATORY DATA:  Recent Labs  07/28/13 1450 07/29/13 0400  WBC 9.6 12.0*  HGB 14.2 12.8*  HCT 41.6 37.8*  PLT 177 167    Recent Labs  07/28/13 1450 07/29/13 0400  NA  --  137  K  --  4.3  CL  --  102  CO2  --  25  BUN  --  22  CREATININE 0.85 0.76  GLUCOSE  --  144*  CALCIUM  --  8.6   Lab Results   Component Value Date   INR 1.04 07/16/2013    Examination:  General appearance: alert, cooperative and no distress Extremities: extremities normal, atraumatic, no cyanosis or edema and Homans sign is negative, no sign of DVT  Wound Exam: clean, dry, intact   Drainage:  Scant/small amount Serosanguinous exudate  Motor Exam: EHL and FHL Intact  Sensory Exam: Deep Peroneal normal   Assessment:    1 Day Post-Op  Procedure(s) (LRB): TOTAL KNEE ARTHROPLASTY (Left)  ADDITIONAL DIAGNOSIS:  Active Problems:   S/P total knee arthroplasty  Acute Blood Loss Anemia   Plan: Physical Therapy as ordered Weight  Bearing as Tolerated (WBAT)  DVT Prophylaxis:  Lovenox  DISCHARGE PLAN: Home  DISCHARGE NEEDS: HHPT, CPM, Walker and 3-in-1 comode seat         Grant Jenkins 07/29/2013, 9:26 AM

## 2013-07-29 NOTE — Progress Notes (Signed)
Physical Therapy Treatment Patient Details Name: Grant DellJimmie Jenkins MRN: [id number] DOB: Jul 22, 1945 Today's Date: 07/29/2013 Time: [id number] PT Time Calculation (min): 15 min  PT Assessment / Plan / Recommendation  History of Present Illness Patient is a 68 yo male s/p Lt TKA.   PT Comments   Nausea somewhat improved from this am; Able to walk into hallway, and L knee was stable in stance Reported dizziness and still some nausea with walking; VSS  Follow Up Recommendations  Home health PT;Supervision/Assistance - 24 hour     Does the patient have the potential to tolerate intense rehabilitation     Barriers to Discharge        Equipment Recommendations  None recommended by PT    Recommendations for Other Services    Frequency 7X/week   Progress towards PT Goals Progress towards PT goals: Progressing toward goals  Plan Current plan remains appropriate    Precautions / Restrictions Precautions Precautions: Knee Precaution Comments: Reviewed precautions with patient and wife. Restrictions LLE Weight Bearing: Weight bearing as tolerated   Pertinent Vitals/Pain 6/10 L knee pain patient repositioned for comfort and Optimal knee extension     Mobility  Bed Mobility Overal bed mobility: Needs Assistance Bed Mobility: Supine to Sit Supine to sit: Min assist General bed mobility comments: Verbal cues for technique.  Assist to move LLE off of and onto bed.   Transfers Overall transfer level: Needs assistance Equipment used: Rolling walker (2 wheeled) Transfers: Sit to/from Stand Sit to Stand: Min assist General transfer comment: Cues for technique and hand placement Ambulation/Gait Ambulation/Gait assistance: Min guard Ambulation Distance (Feet): 25 Feet Assistive device: Rolling walker (2 wheeled) Gait Pattern/deviations: Step-to pattern General Gait Details: Cues for optimal step length, and to activate L quad for stance stability; pt reported "everything is moving in  circles" so we sat down; RN aware    Exercises     PT Diagnosis:    PT Problem List:   PT Treatment Interventions:     PT Goals (current goals can now be found in the care plan section) Acute Rehab PT Goals Patient Stated Goal: To go home tomorrow PT Goal Formulation: With patient/family Time For Goal Achievement: 08/04/13 Potential to Achieve Goals: Good  Visit Information  Last PT Received On: 07/29/13 Assistance Needed: +1 History of Present Illness: Patient is a 68 yo male s/p Lt TKA.    Subjective Data  Subjective: Continued nausea, though better than this am Patient Stated Goal: To go home tomorrow   Cognition  Cognition Arousal/Alertness: Awake/alert Behavior During Therapy: WFL for tasks assessed/performed Overall Cognitive Status: Within Functional Limits for tasks assessed    Balance     End of Session PT - End of Session Activity Tolerance: Other (comment) (limited by dizziness) Patient left: in chair;with call bell/phone within reach;with family/visitor present Nurse Communication: Mobility status   GP     Van ClinesGarrigan, Analeese Andreatta Hamff 07/29/2013, 4:41 PM Van ClinesHolly Trevan Messman, PT  Acute Rehabilitation Services Pager [phone] Office [phone]

## 2013-07-29 NOTE — Op Note (Signed)
TOTAL KNEE REPLACEMENT OPERATIVE NOTE:  07/28/2013  1:57 PM  PATIENT:  Grant Jenkins  68 y.o. male  PRE-OPERATIVE DIAGNOSIS:  osteoarthritis left knee  POST-OPERATIVE DIAGNOSIS:  osteoarthritis left knee  PROCEDURE:  Procedure(s): TOTAL KNEE ARTHROPLASTY  SURGEON:  Surgeon(s): Dannielle Huh, MD  PHYSICIAN ASSISTANT: [name], [facility]  ANESTHESIA:   general  DRAINS: Hemovac  SPECIMEN: None  COUNTS:  Correct  TOURNIQUET:   Total Tourniquet Time Documented: Thigh (Left) - 65 minutes Total: Thigh (Left) - 65 minutes   DICTATION:  Indication for procedure:    The patient is a 68 y.o. male who has failed conservative treatment for osteoarthritis left knee.  Informed consent was obtained prior to anesthesia. The risks versus benefits of the operation were explain and in a way the patient can, and did, understand.   On the implant demand matching protocol, this patient scored 11.  Therefore, this patient was not receive a polyethylene insert with vitamin E which is a high demand implant.  Description of procedure:     The patient was taken to the operating room and placed under anesthesia.  The patient was positioned in the usual fashion taking care that all body parts were adequately padded and/or protected.  I foley catheter was not placed.  A tourniquet was applied and the leg prepped and draped in the usual sterile fashion.  The extremity was exsanguinated with the esmarch and tourniquet inflated to 350 mmHg.  Pre-operative range of motion was normal.  The knee was in 5 degree of mild varus.  A midline incision approximately 6-7 inches long was made with a #10 blade.  A new blade was used to make a parapatellar arthrotomy going 2-3 cm into the quadriceps tendon, over the patella, and alongside the medial aspect of the patellar tendon.  A synovectomy was then performed with the #10 blade and forceps. I then elevated the deep MCL off the medial tibial metaphysis subperiosteally  around to the semimembranosus attachment.    I everted the patella and used calipers to measure patellar thickness.  I used the reamer to ream down to appropriate thickness to recreate the native thickness.  I then removed excess bone with the rongeur and sagittal saw.  I used the appropriately sized template and drilled the three lug holes.  I then put the trial in place and measured the thickness with the calipers to ensure recreation of the native thickness.  The trial was then removed and the patella subluxed and the knee brought into flexion.  A homan retractor was place to retract and protect the patella and lateral structures.  A Z-retractor was place medially to protect the medial structures.  The extra-medullary alignment system was used to make cut the tibial articular surface perpendicular to the anamotic axis of the tibia and in 3 degrees of posterior slope.  The cut surface and alignment jig was removed.  I then used the intramedullary alignment guide to make a 6 valgus cut on the distal femur.  I then marked out the epicondylar axis on the distal femur.  The posterior condylar axis measured 3 degrees.  I then used the anterior referencing sizer and measured the femur to be a size 11.  The 4-In-1 cutting block was screwed into place in external rotation matching the posterior condylar angle, making our cuts perpendicular to the epicondylar axis.  Anterior, posterior and chamfer cuts were made with the sagittal saw.  The cutting block and cut pieces were removed.  A lamina  spreader was placed in 90 degrees of flexion.  The ACL, PCL, menisci, and posterior condylar osteophytes were removed.  A 11 mm spacer blocked was found to offer good flexion and extension gap balance after minimal in degree releasing.   The scoop retractor was then placed and the femoral finishing block was pinned in place.  The small sagittal saw was used as well as the lug drill to finish the femur.  The block and cut  surfaces were removed and the medullary canal hole filled with autograft bone from the cut pieces.  The tibia was delivered forward in deep flexion and external rotation.  A size G tray was selected and pinned into place centered on the medial 1/3 of the tibial tubercle.  The reamer and keel was used to prepare the tibia through the tray.    I then trialed with the size 11 femur, size G tibia, a 11 mm insert and the 38 patella.  I had excellent flexion/extension gap balance, excellent patella tracking.  Flexion was full and beyond 120 degrees; extension was zero.  These components were chosen and the staff opened them to me on the back table while the knee was lavaged copiously and the cement mixed.  The soft tissue was infiltrated with 60cc of exparel 1.3% through a 21 gauge needle.  I cemented in the components and removed all excess cement.  The polyethylene tibial component was snapped into place and the knee placed in extension while cement was hardening.  The capsule was infilltrated with 30cc of .25% Marcaine with epinephrine.  A hemovac was place in the joint exiting superolaterally.  A pain pump was place superomedially superficial to the arthrotomy.  Once the cement was hard, the tourniquet was let down.  Hemostasis was obtained.  The arthrotomy was closed with figure-8 #1 vicryl sutures.  The deep soft tissues were closed with #0 vicryls and the subcuticular layer closed with a running #2-0 vicryl.  The skin was reapproximated and closed with skin staples.  The wound was dressed with xeroform, 4 x4's, 2 ABD sponges, a single layer of webril and a TED stocking.   The patient was then awakened, extubated, and taken to the recovery room in stable condition.  BLOOD LOSS:  300cc DRAINS: 1 hemovac, 1 pain catheter COMPLICATIONS:  None.  PLAN OF CARE: Admit to inpatient   PATIENT DISPOSITION:  PACU - hemodynamically stable.   Delay start of Pharmacological VTE agent (>24hrs) due to surgical  blood loss or risk of bleeding:  not applicable  Please fax a copy of this op note to my office at [phone] (please only include page 1 and 2 of the Case Information op note)

## 2013-07-29 NOTE — Progress Notes (Signed)
Occupational Therapy Evaluation Patient Details Name: Grant Jenkins MRN: [id number] DOB: [birth year] Today's Date: 07/29/2013 Time: 2130-8657 OT Time Calculation (min): 46 min  OT Assessment / Plan / Recommendation History of present illness Patient is a 68 yo male s/p Lt TKA.   Clinical Impression   PTA pt lived at home with wife and was Independent in ADLs and mobility. Education provided regarding compensatory techniques for dressing LB this date. Pt performed grooming activities in bed with setup assist. Pt c/o nausea, possibly as a result of his pain medication. Pt attempted toilet transfer and after sit>stand from bed became nauseous and had to lay down. Pt's nausea is limiting functional activity tolerance and was unable to practice toilet or shower transfer for increased safety and independence at home. Pt would benefit from continued OT for toilet and shower transfers when nausea is controlled.     OT Assessment  Patient needs continued OT Services    Follow Up Recommendations  Supervision/Assistance - 24 hour;Home health OT       Equipment Recommendations  Other (comment)       Frequency  Min 2X/week    Precautions / Restrictions Precautions Precautions: Knee Restrictions Weight Bearing Restrictions: Yes LLE Weight Bearing: Weight bearing as tolerated   Pertinent Vitals/Pain Pt c/o severe nausea aggravated with movement; pt states that pain in LLE is better today    ADL  Eating/Feeding: Independent Where Assessed - Eating/Feeding: Edge of bed Grooming: Wash/dry hands;Wash/dry face;Shaving;Teeth care;Set up Where Assessed - Grooming: Unsupported sitting Upper Body Bathing: Independent Where Assessed - Upper Body Bathing: Unsupported sitting Lower Body Bathing: Minimal assistance Where Assessed - Lower Body Bathing: Supported sit to stand Upper Body Dressing: Set up Where Assessed - Upper Body Dressing: Unsupported sitting Lower Body Dressing: Moderate  assistance Where Assessed - Lower Body Dressing: Supported sit to stand Equipment Used: Gait belt;Rolling walker Transfers/Ambulation Related to ADLs: Pt attempted to ambulate to the toilet but became extremely nauseous after sit>stand transfer and had to sit down. ADL Comments: Pt's nausea limited ability to practice toilet and shower transfer.    OT Diagnosis: Generalized weakness;Acute pain  OT Problem List: Decreased activity tolerance;Decreased range of motion;Impaired balance (sitting and/or standing);Decreased safety awareness;Decreased knowledge of use of DME or AE;Decreased knowledge of precautions;Pain;Other (comment) (Nausea affecting functional activity tolerance) OT Treatment Interventions: Self-care/ADL training;DME and/or AE instruction;Therapeutic activities;Patient/family education;Balance training   OT Goals(Current goals can be found in the care plan section) Acute Rehab OT Goals Patient Stated Goal: To go home tomorrow OT Goal Formulation: With patient Time For Goal Achievement: 08/05/13 Potential to Achieve Goals: Good  Visit Information  Last OT Received On: 07/29/13 Assistance Needed: +1 History of Present Illness: Patient is a 68 yo male s/p Lt TKA.       Prior Functioning     Home Living Family/patient expects to be discharged to:: Private residence Living Arrangements: Spouse/significant other Available Help at Discharge: Family;Available 24 hours/day Type of Home: House Home Access: Stairs to enter [employer] of Steps: 3 Entrance Stairs-Rails: None Home Layout: One level Home Equipment: Walker - 2 wheels;Bedside commode Prior Function Level of Independence: Independent Communication Communication: No difficulties Dominant Hand: Right         Vision/Perception Vision - History Patient Visual Report: No change from baseline   Cognition  Cognition Arousal/Alertness: Awake/alert Behavior During Therapy: WFL for tasks  assessed/performed Overall Cognitive Status: Within Functional Limits for tasks assessed    Extremity/Trunk Assessment Upper Extremity Assessment Upper Extremity Assessment: Overall  WFL for tasks assessed     Mobility Bed Mobility Overal bed mobility: Needs Assistance Bed Mobility: Supine to Sit;Sit to Supine Supine to sit: Min assist (A for LLE) Sit to supine: Min assist (A for LLE) General bed mobility comments: Verbal cues for technique.  Assist to move LLE off of bed.  Good sitting balance once upright. Transfers Overall transfer level: Needs assistance Equipment used: Rolling walker (2 wheeled) Transfers: Sit to/from Stand Sit to Stand: Min assist General transfer comment: VC's for hand placement required. Pt became agitated that he could not bend L knee to assist with standing           End of Session OT - End of Session Equipment Utilized During Treatment: Gait belt;Rolling walker;Other (comment) (CPM) Activity Tolerance: Other (comment) (Pt limited by nausea) Patient left: in bed;in CPM;with call bell/phone within reach;with family/visitor present Nurse Communication: Other (comment) (Pt c/o pain medication making him nauseous) CPM Left Knee CPM Left Knee: On Left Knee Flexion (Degrees): 60 Left Knee Extension (Degrees): 0 Additional Comments: Pt educated to increase CPM by 5 degrees intermittently to tolerance in order to increase to 0-90 degrees       Rae LipsMiller, Toniann Dickerson M [phone] 07/29/2013, 9:48 AM

## 2013-07-30 LAB — CBC
HCT: 38.6 % — ABNORMAL LOW (ref 39.0–52.0)
HEMOGLOBIN: 13.3 g/dL (ref 13.0–17.0)
MCH: 32.4 pg (ref 26.0–34.0)
MCHC: 34.5 g/dL (ref 30.0–36.0)
MCV: 93.9 fL (ref 78.0–100.0)
PLATELETS: 191 10*3/uL (ref 150–400)
RBC: 4.11 MIL/uL — ABNORMAL LOW (ref 4.22–5.81)
RDW: 13.5 % (ref 11.5–15.5)
WBC: 12.6 10*3/uL — AB (ref 4.0–10.5)

## 2013-07-30 NOTE — Progress Notes (Signed)
Occupational Therapy Treatment and Discharge Patient Details Name: Obbie Lewallen MRN: [id number] DOB: [birth year] Today's Date: 07/30/2013 Time: 1594-7076 OT Time Calculation (min): 11 min  OT Assessment / Plan / Recommendation  History of present illness Patient is a 68 yo male s/p Lt TKA.   OT comments  Pt significantly better today with reported less nausea. Education and training provided today for walk-in shower transfer using RW and backwards or side-step technique, depending on width of shower door opening. Demonstration and education provided with return demo of use of leg lifter for bed mobility and to assist with putting leg in CPM. Pt has no further acute OT needs. Ready for D/C from OT standpoint.   Follow Up Recommendations  Supervision/Assistance - 24 hour;No OT follow up       Equipment Recommendations  None recommended by OT       Frequency Other (comment) Discharge; no further acute OT needs  Progress towards OT Goals Progress towards OT goals: Goals met/education completed, patient discharged from [facility] Discharge plan needs to be updated    Precautions / Restrictions Precautions Precautions: Knee Precaution Comments: Reviewed precautions with patient and wife. Restrictions Weight Bearing Restrictions: Yes LLE Weight Bearing: Weight bearing as tolerated   Pertinent Vitals/Pain No c/o pain    ADL  Tub/Shower Transfer: Min guard Tub/Shower Transfer Method: Therapist, art: Walk in shower Equipment Used: Gait belt;Rolling walker ADL Comments: Demo and education provided with return demo of use of leg lifter to assist with bed mobility and adjusting leg in CPM      OT Goals(current goals can now be found in the care plan section)    Visit Information  Last OT Received On: 07/30/13 Assistance Needed: +1 History of Present Illness: Patient is a 68 yo male s/p Lt TKA.          Cognition  Cognition Arousal/Alertness:  Awake/alert Behavior During Therapy: WFL for tasks assessed/performed Overall Cognitive Status: Within Functional Limits for tasks assessed    Mobility  Transfers Overall transfer level: Needs assistance Equipment used: Rolling walker (2 wheeled) Transfers: Sit to/from Stand Sit to Stand: Min assist General transfer comment: Cues for technique and hand placement          End of Session OT - End of Session Equipment Utilized During Treatment: Rolling walker;Gait belt Activity Tolerance: Patient tolerated treatment well Patient left: in chair;with call bell/phone within reach;with family/visitor present Nurse Communication: Other (comment) (Pt ready for D/C from OT standpoint)       Juluis Rainier [phone] 07/30/2013, 9:47 AM

## 2013-07-30 NOTE — Progress Notes (Signed)
SPORTS MEDICINE AND JOINT REPLACEMENT  Georgena SpurlingStephen Lucey, MD   Altamese CabalMaurice Mercedes Valeriano, PA-C [address], [place], [place]  [address]                             [phone]   PROGRESS NOTE  Subjective:  negative for Chest Pain  negative for Shortness of Breath  negative for Nausea/Vomiting   negative for Calf Pain  negative for Bowel Movement   Tolerating Diet: yes         Patient reports pain as 3 on 0-10 scale.    Objective: Vital signs in last 24 hours:   Patient Vitals for the past 24 hrs:  BP Temp Temp src Pulse Resp SpO2 Height Weight  07/30/13 0741 130/64 mmHg 97.6 F (36.4 C) Oral 72 18 95 % - -  07/30/13 0002 137/79 mmHg 99.9 F (37.7 C) Oral 91 18 97 % - -  07/30/13 0000 - - - - 16 99 % - -  07/29/13 2000 - - - - 18 98 % - -  07/29/13 1610 151/77 mmHg 98.4 F (36.9 C) Oral 88 18 98 % - -  07/29/13 1545 - - - - 18 97 % - -  07/29/13 1200 - - - - 16 96 % - -  07/29/13 0904 - - - - - - 6\' 4"  (1.93 m) 108.863 kg (240 lb)    @flow {1959:LAST@   Intake/Output from previous day:   03/10 0701 - 03/11 0700 In: 240 [P.O.:240] Out: 1025 [Urine:600; Drains:425]   Intake/Output this shift:       Intake/Output     03/10 0701 - 03/11 0700 03/11 0701 - 03/12 0700   P.O. 240    I.V. (mL/kg)     Total Intake(mL/kg) 240 (2.2)    Urine (mL/kg/hr) 600 (0.2)    Drains 425 (0.2)    Blood     Total Output 1025     Net -785          Urine Occurrence 3 x    Emesis Occurrence 1 x       LABORATORY DATA:  Recent Labs  07/28/13 1450 07/29/13 0400 07/30/13 0538  WBC 9.6 12.0* 12.6*  HGB 14.2 12.8* 13.3  HCT 41.6 37.8* 38.6*  PLT 177 167 191    Recent Labs  07/28/13 1450 07/29/13 0400  NA  --  137  K  --  4.3  CL  --  102  CO2  --  25  BUN  --  22  CREATININE 0.85 0.76  GLUCOSE  --  144*  CALCIUM  --  8.6   Lab Results  Component Value Date   INR 1.04 07/16/2013    Examination:  General appearance: alert, cooperative and no distress Extremities:  Homans sign is negative, no sign of DVT  Wound Exam: clean, dry, intact   Drainage:  None: wound tissue dry  Motor Exam: EHL and FHL Intact  Sensory Exam: Deep Peroneal normal   Assessment:    2 Days Post-Op  Procedure(s) (LRB): TOTAL KNEE ARTHROPLASTY (Left)  ADDITIONAL DIAGNOSIS:  Active Problems:   S/P total knee arthroplasty  Acute Blood Loss Anemia   Plan: Physical Therapy as ordered Weight Bearing as Tolerated (WBAT)  DVT Prophylaxis:  Lovenox  DISCHARGE PLAN: Home  DISCHARGE NEEDS: HHPT, CPM, Walker and 3-in-1 comode seat         Etienne Mowers 07/30/2013, 8:17 AM

## 2013-07-30 NOTE — Progress Notes (Signed)
Discharge teachings done with pt and wife. All questions answered. Pt is able to return demonstrate proper administration of Lovenox. Pt declines AM meds. Pt states, "he will take his medications when he gets home" and "will get on his home medication regimen". Pt discharged to home with wife, discharge teachings and prescriptions via wheelchair without incident.

## 2013-07-30 NOTE — Progress Notes (Signed)
Physical Therapy Treatment Patient Details Name: Nona DellJimmie Birchler MRN: [id number] DOB: [birth year] Today's Date: 07/30/2013 Time: 0801-0829 PT Time Calculation (min): 28 min  PT Assessment / Plan / Recommendation  History of Present Illness Patient is a 68 yo male s/p Lt TKA.   PT Comments   Patient progressing much better this session. Planning to DC later today  Follow Up Recommendations  Home health PT;Supervision/Assistance - 24 hour     Does the patient have the potential to tolerate intense rehabilitation     Barriers to Discharge        Equipment Recommendations  None recommended by PT    Recommendations for Other Services    Frequency 7X/week   Progress towards PT Goals Progress towards PT goals: Progressing toward goals  Plan Current plan remains appropriate    Precautions / Restrictions Precautions Precautions: Knee Precaution Comments: Reviewed precautions with patient and wife. Restrictions Weight Bearing Restrictions: Yes LLE Weight Bearing: Weight bearing as tolerated   Pertinent Vitals/Pain no apparent distress     Mobility  Bed Mobility Supine to sit: Supervision Transfers Overall transfer level: Needs assistance Equipment used: Rolling walker (2 wheeled) Transfers: Sit to/from Stand Sit to Stand: Min guard General transfer comment: Cues for technique and hand placement Ambulation/Gait Ambulation/Gait assistance: Min guard Ambulation Distance (Feet): 150 Feet Assistive device: Rolling walker (2 wheeled) Gait Pattern/deviations: Step-through pattern;Decreased stride length General Gait Details: patient working to step through gait pattern Stairs: Yes Stairs assistance: Min assist Stair Management: No rails;Step to pattern;Backwards;With walker Number of Stairs: 2 General stair comments: CUes for sequency and technique. A to hold RW    Exercises     PT Diagnosis:    PT Problem List:   PT Treatment Interventions:     PT Goals (current goals  can now be found in the care plan section)    Visit Information  Last PT Received On: 07/30/13 Assistance Needed: +1 History of Present Illness: Patient is a 68 yo male s/p Lt TKA.    Subjective Data      Cognition  Cognition Arousal/Alertness: Awake/alert Behavior During Therapy: WFL for tasks assessed/performed Overall Cognitive Status: Within Functional Limits for tasks assessed    Balance     End of Session PT - End of Session Equipment Utilized During Treatment: Gait belt Activity Tolerance: Patient tolerated treatment well Patient left: in chair;with call bell/phone within reach Nurse Communication: Mobility status   GP     Fredrich BirksRobinette, Julia Elizabeth 07/30/2013, 11:55 AM  07/30/2013 Fredrich Birksobinette, Julia Elizabeth PTA [phone] pager [phone] office

## 2013-08-04 NOTE — Discharge Summary (Signed)
SPORTS MEDICINE & JOINT REPLACEMENT   Grant SpurlingStephen Lucey, MD   Grant CabalMaurice Raelynne Ludwick, PA-C [address], [place], [place]  [address]                             [phone]  PATIENT ID: Grant Jenkins        MRN:  [id number]          DOB/AGE: 20-Feb-1946 / 68 y.o.    DISCHARGE SUMMARY  ADMISSION DATE:    07/28/2013 DISCHARGE DATE: 07/30/2013  ADMISSION DIAGNOSIS: osteoarthritis left knee    DISCHARGE DIAGNOSIS:  osteoarthritis left knee    ADDITIONAL DIAGNOSIS: Active Problems:   S/P total knee arthroplasty  Past Medical History  Diagnosis Date  . Hypothyroidism   . Arthritis     psoriatic  . Hypertension     Grant Jenkins   Grant Jenkins  . Complication of anesthesia     pt stated he was slow to wake up    PROCEDURE: Procedure(s): TOTAL KNEE ARTHROPLASTY on 07/28/2013  CONSULTS:     HISTORY:  See H&P in chart  HOSPITAL COURSE:  Grant Jenkins is a 68 y.o. admitted on 07/28/2013 and found to have a diagnosis of osteoarthritis left knee.  After appropriate laboratory studies were obtained  they were taken to the operating room on 07/28/2013 and underwent Procedure(s): TOTAL KNEE ARTHROPLASTY.   They were given perioperative antibiotics:  Anti-infectives   Start     Dose/Rate Route Frequency Ordered Stop   07/28/13 2030  vancomycin (VANCOCIN) IVPB 1000 mg/200 mL premix     1,000 mg 200 mL/hr over 60 Minutes Intravenous Every 12 hours 07/28/13 1305 07/28/13 2214   07/28/13 0600  vancomycin (VANCOCIN) 1,500 mg in sodium chloride 0.9 % 500 mL IVPB     1,500 mg 250 mL/hr over 120 Minutes Intravenous On call to O.R. 07/27/13 1410 07/28/13 1005    .  Tolerated the procedure well.  Placed with a foley intraoperatively.  Given Ofirmev at induction and for 48 hours.    POD# 1: Vital signs were stable.  Patient denied Chest pain, shortness of breath, or calf pain.  Patient was started on Lovenox 30 mg subcutaneously twice daily at 8am.  Consults to PT, OT, and care management were made.   The patient was weight bearing as tolerated.  CPM was placed on the operative leg 0-90 degrees for 6-8 hours a day.  Incentive spirometry was taught.  Dressing was changed.  Marcaine pump and hemovac were discontinued.      POD #2, Continued  PT for ambulation and exercise program.  IV saline locked.  O2 discontinued.    The remainder of the hospital course was dedicated to ambulation and strengthening.   The patient was discharged on 2 days post op in  Good condition.  Blood products given:none  DIAGNOSTIC STUDIES: Recent vital signs: No data found.      Recent laboratory studies:  Recent Labs  07/28/13 1450 07/29/13 0400 07/30/13 0538  WBC 9.6 12.0* 12.6*  HGB 14.2 12.8* 13.3  HCT 41.6 37.8* 38.6*  PLT 177 167 191    Recent Labs  07/28/13 1450 07/29/13 0400  NA  --  137  K  --  4.3  CL  --  102  CO2  --  25  BUN  --  22  CREATININE 0.85 0.76  GLUCOSE  --  144*  CALCIUM  --  8.6   Lab  Results  Component Value Date   INR 1.04 07/16/2013     Recent Radiographic Studies :  Dg Chest 2 View  07/16/2013   CLINICAL DATA:  Preop left total knee arthroplasty.  EXAM: CHEST  2 VIEW  COMPARISON:  None.  FINDINGS: Trachea is midline. Heart size normal. Lungs are clear. No pleural fluid.  IMPRESSION: No acute findings.   Electronically Signed   By: Grant Jenkins M.D.   On: 07/16/2013 14:23    DISCHARGE INSTRUCTIONS: Discharge Orders   Future Orders Complete By Expires   Call MD / Call 911  As directed    Comments:     If you experience chest pain or shortness of breath, CALL 911 and be transported to the hospital emergency room.  If you develope a fever above 101 F, pus (white drainage) or increased drainage or redness at the wound, or calf pain, call your surgeon's office.   Change dressing  As directed    Comments:     Change dressing on wednesday, then change the dressing daily with sterile 4 x 4 inch gauze dressing and apply TED hose.   Constipation Prevention  As  directed    Comments:     Drink plenty of fluids.  Prune juice may be helpful.  You may use a stool softener, such as Colace (over the counter) 100 mg twice a day.  Use MiraLax (over the counter) for constipation as needed.   CPM  As directed    Comments:     Continuous passive motion machine (CPM):      Use the CPM from 0 to 90 for 6-8 hours per day.      You may increase by 10 per day.  You may break it up into 2 or 3 sessions per day.      Use CPM for 2 weeks or until you are told to stop.   Diet - low sodium heart healthy  As directed    Do not put a pillow under the knee. Place it under the heel.  As directed    Driving restrictions  As directed    Comments:     No driving for 6 weeks   Increase activity slowly as tolerated  As directed    Lifting restrictions  As directed    Comments:     No lifting for 6 weeks   TED hose  As directed    Comments:     Use stockings (TED hose) for 3 weeks on both leg(s).  You may remove them at night for sleeping.      DISCHARGE MEDICATIONS:     Medication List    STOP taking these medications       aspirin EC 81 MG tablet      TAKE these medications       acetaminophen 325 MG tablet  Commonly known as:  TYLENOL  Take 650 mg by mouth every 6 (six) hours as needed for mild pain, moderate pain, fever or headache.     clobetasol cream 0.05 %  Commonly known as:  TEMOVATE  Apply 1 application topically 2 (two) times daily.     enoxaparin 40 MG/0.4ML injection  Commonly known as:  LOVENOX  Inject 0.4 mLs (40 mg total) into the skin daily.     folic acid 1 MG tablet  Commonly known as:  FOLVITE  Take 1 mg by mouth daily.     HYDROmorphone 2 MG tablet  Commonly known as:  DILAUDID  Take 1 tablet (2 mg total) by mouth every 4 (four) hours as needed for severe pain.     methocarbamol 500 MG tablet  Commonly known as:  ROBAXIN  Take 1-2 tablets (500-1,000 mg total) by mouth every 6 (six) hours as needed for muscle spasms.      methotrexate 2.5 MG tablet  Commonly known as:  RHEUMATREX  Take 10 mg by mouth once a week. Caution:Chemotherapy. Protect from light.     nabumetone 500 MG tablet  Commonly known as:  RELAFEN  Take 1,000 mg by mouth 2 (two) times daily as needed for mild pain or moderate pain.     ondansetron 4 MG tablet  Commonly known as:  ZOFRAN  Take 1 tablet (4 mg total) by mouth every 6 (six) hours as needed for nausea.     oxyCODONE 5 MG immediate release tablet  Commonly known as:  Oxy IR/ROXICODONE  Take 1-2 tablets (5-10 mg total) by mouth every 4 (four) hours as needed for breakthrough pain.     TRIBENZOR 40-5-12.5 MG Tabs  Generic drug:  Olmesartan-Amlodipine-HCTZ  Take 1 tablet by mouth daily.        FOLLOW UP VISIT:       Follow-up Information   Follow up with Raymon Mutton, MD. Call on 08/12/2013.   Specialty:  Orthopedic Surgery   Contact information:   [address] [place] [address] [phone]       DISPOSITION: HOME   CONDITION:  Good   Amarionna Arca 08/04/2013, 9:21 AM

## 2015-10-19 IMAGING — CR DG CHEST 2V
2 series · 2 of 2 positions shown · non-contrast
Comparison: None.

CLINICAL DATA: Preop left total knee arthroplasty.

EXAM:
CHEST  2 VIEW

[w chest pa]
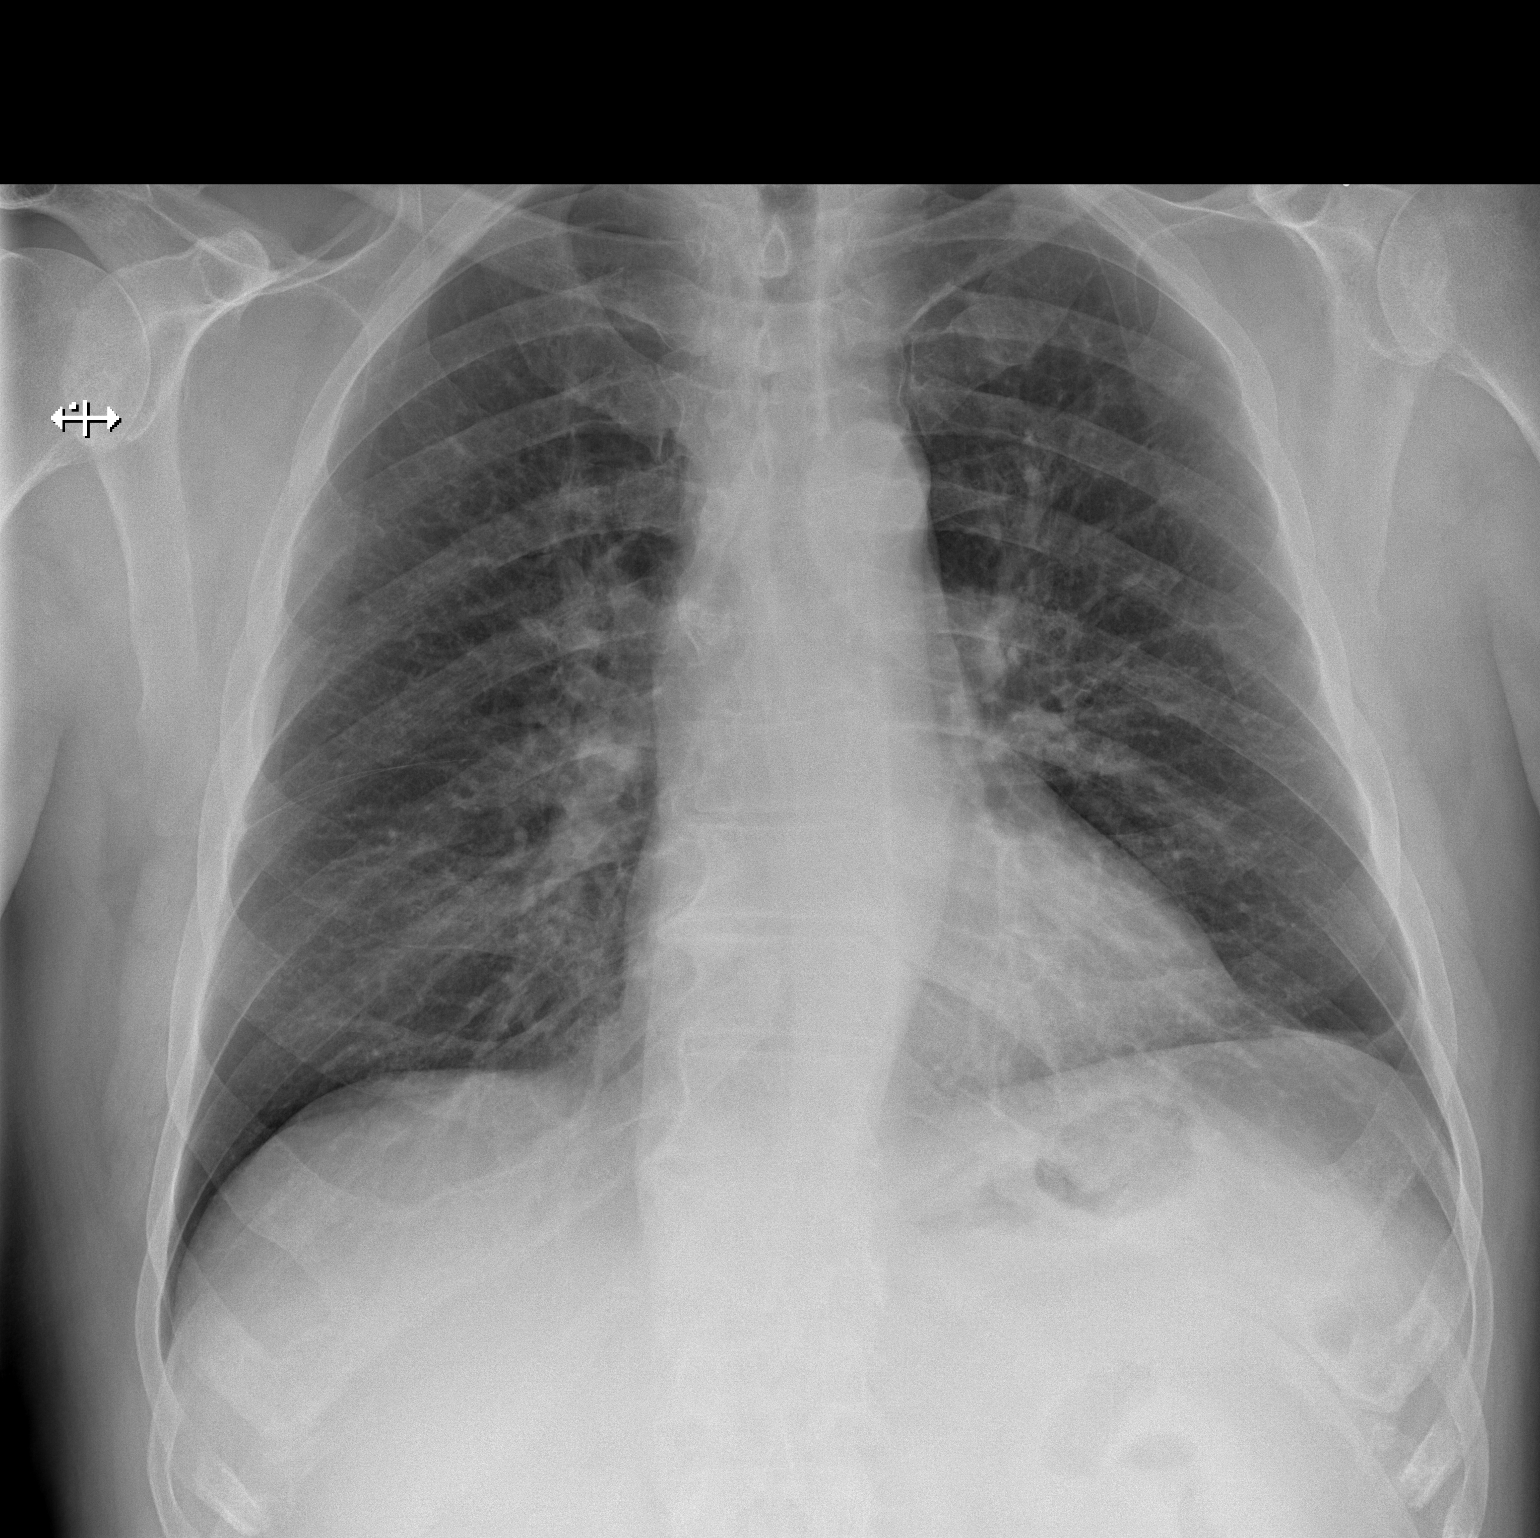

[w chest lat]
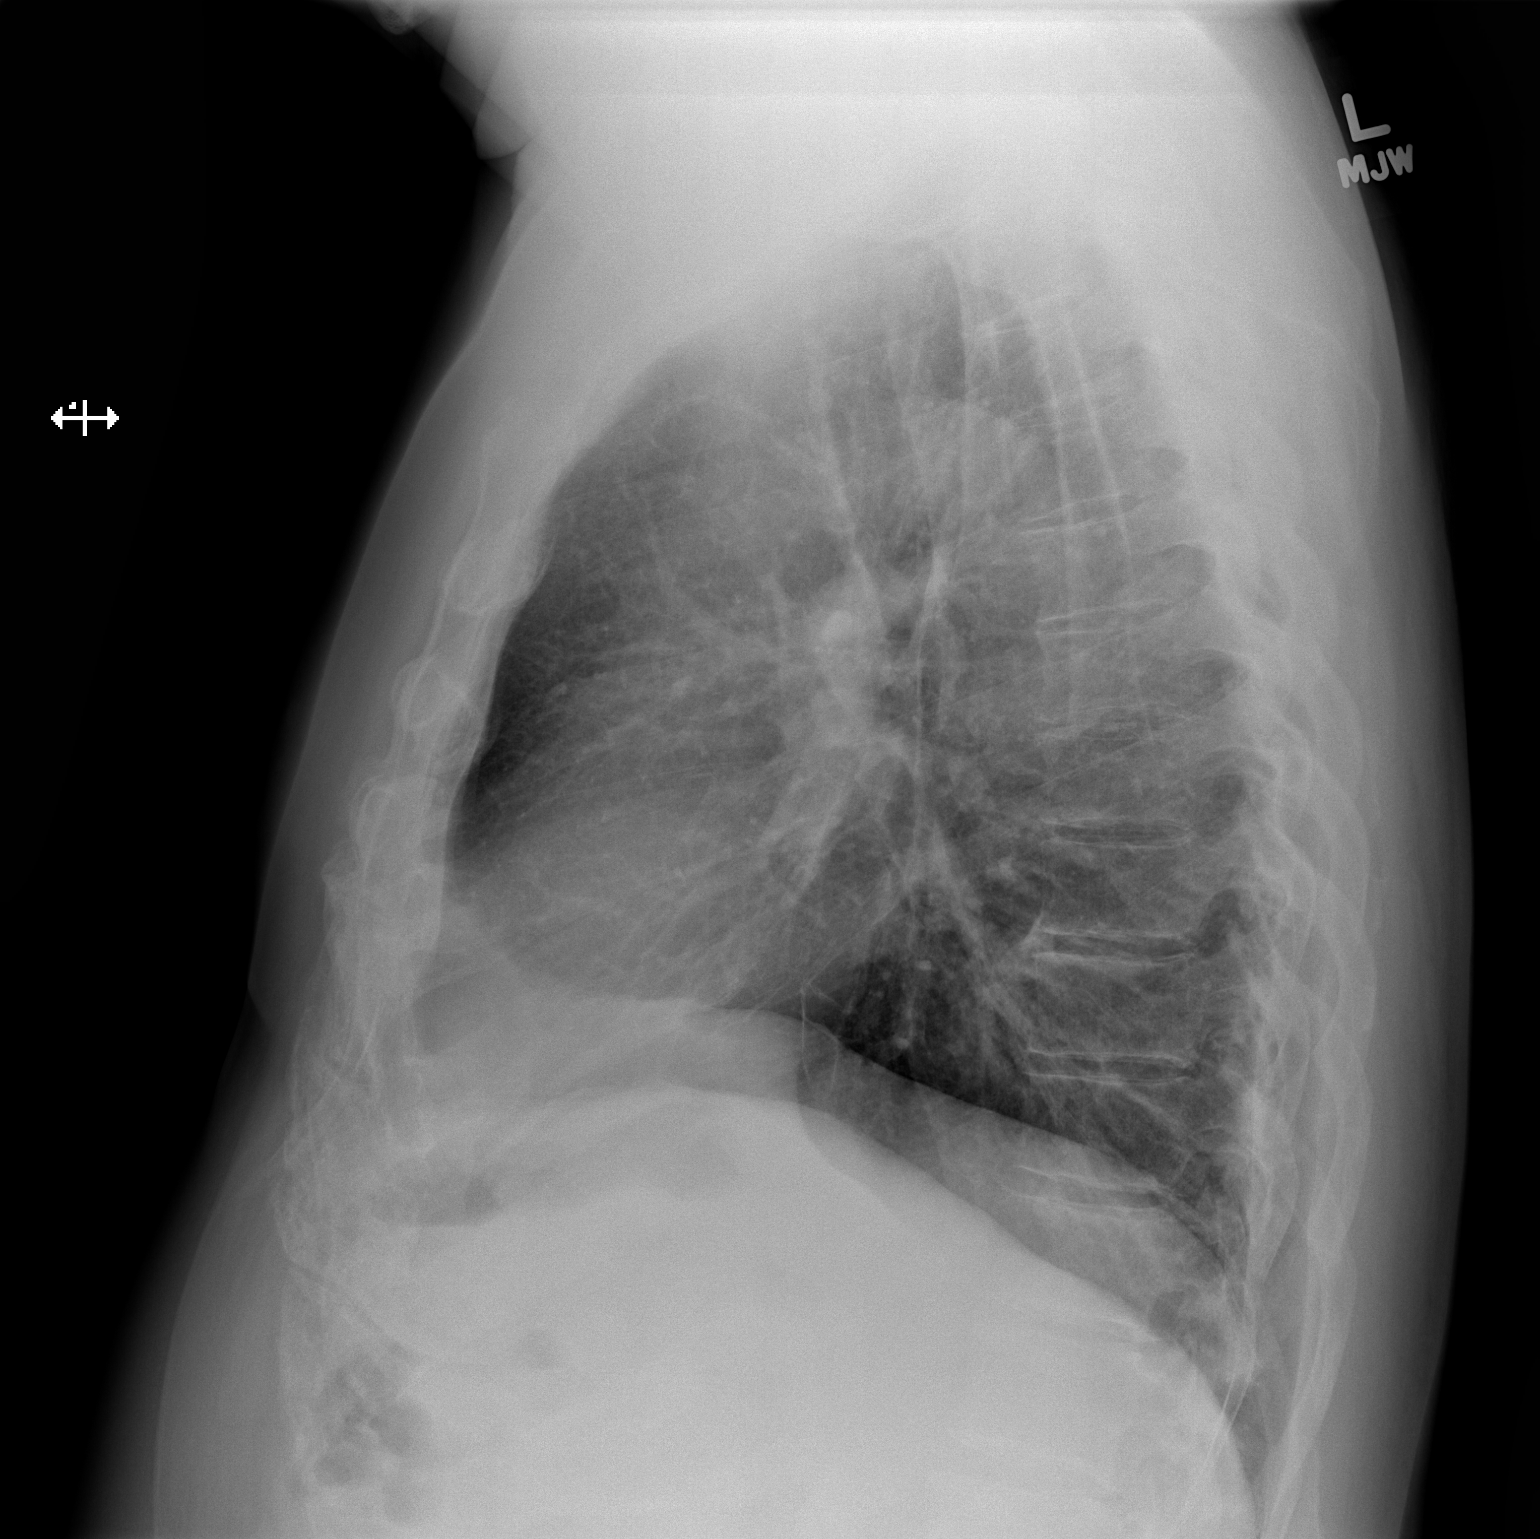

[2 of 2 positions shown; findings below may reference images not displayed]

FINDINGS: Trachea is midline. Heart size normal. Lungs are clear. No pleural
fluid.
IMPRESSION: No acute findings.
